# Patient Record
Sex: Female | Born: 1985 | Race: Black or African American | Hispanic: No | Marital: Single | Smoking: Never smoker
Health system: Southern US, Community
[De-identification: ages and names within clinical notes are randomized; demographics above are authoritative.]

## PROBLEM LIST (undated history)

## (undated) ENCOUNTER — Inpatient Hospital Stay: Payer: Self-pay

## (undated) DIAGNOSIS — A6000 Herpesviral infection of urogenital system, unspecified: Secondary | ICD-10-CM

## (undated) DIAGNOSIS — R87629 Unspecified abnormal cytological findings in specimens from vagina: Secondary | ICD-10-CM

## (undated) HISTORY — PX: INDUCED ABORTION: SHX677

---
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
## 2007-03-30 ENCOUNTER — Emergency Department: Payer: Self-pay | Admitting: Emergency Medicine

## 2007-07-07 ENCOUNTER — Emergency Department: Payer: Self-pay | Admitting: Emergency Medicine

## 2007-08-13 ENCOUNTER — Observation Stay: Payer: Self-pay | Admitting: Obstetrics & Gynecology

## 2007-11-10 ENCOUNTER — Observation Stay: Payer: Self-pay | Admitting: Obstetrics and Gynecology

## 2007-12-06 ENCOUNTER — Observation Stay: Payer: Self-pay

## 2007-12-08 ENCOUNTER — Observation Stay: Payer: Self-pay | Admitting: Obstetrics and Gynecology

## 2007-12-12 ENCOUNTER — Inpatient Hospital Stay: Payer: Self-pay | Admitting: Unknown Physician Specialty

## 2009-02-09 ENCOUNTER — Emergency Department: Payer: Self-pay | Admitting: Internal Medicine

## 2009-12-02 ENCOUNTER — Emergency Department: Payer: Self-pay | Admitting: Emergency Medicine

## 2010-05-23 ENCOUNTER — Emergency Department: Payer: Self-pay | Admitting: Emergency Medicine

## 2011-05-04 ENCOUNTER — Emergency Department: Payer: Self-pay | Admitting: Emergency Medicine

## 2012-04-19 ENCOUNTER — Emergency Department: Payer: Self-pay | Admitting: Emergency Medicine

## 2012-04-19 LAB — URINALYSIS, COMPLETE
Bilirubin,UR: NEGATIVE
Glucose,UR: NEGATIVE mg/dL (ref 0–75)
Ketone: NEGATIVE
Nitrite: NEGATIVE
Protein: NEGATIVE
RBC,UR: <1 /HPF (ref 0–5)
Specific Gravity: 1.018 (ref 1.003–1.030)
WBC UR: 2 /HPF (ref 0–5)

## 2012-04-19 LAB — CBC
HCT: 41.2 % (ref 35.0–47.0)
HGB: 14.3 g/dL (ref 12.0–16.0)
RBC: 4.48 10*6/uL (ref 3.80–5.20)
RDW: 12.3 % (ref 11.5–14.5)
WBC: 10.1 10*3/uL (ref 3.6–11.0)

## 2012-04-19 LAB — COMPREHENSIVE METABOLIC PANEL
Alkaline Phosphatase: 134 U/L (ref 50–136)
BUN: 10 mg/dL (ref 7–18)
Bilirubin,Total: 0.6 mg/dL (ref 0.2–1.0)
Calcium, Total: 8.7 mg/dL (ref 8.5–10.1)
Co2: 27 mmol/L (ref 21–32)
Creatinine: 1.01 mg/dL (ref 0.60–1.30)
EGFR (Non-African Amer.): >60
Osmolality: 269 (ref 275–301)
Potassium: 3.3 mmol/L — ABNORMAL LOW (ref 3.5–5.1)
Sodium: 135 mmol/L — ABNORMAL LOW (ref 136–145)
Total Protein: 8 g/dL (ref 6.4–8.2)

## 2013-02-12 ENCOUNTER — Emergency Department: Payer: Self-pay | Admitting: Emergency Medicine

## 2013-11-15 ENCOUNTER — Encounter: Payer: Self-pay | Admitting: General Surgery

## 2013-11-28 ENCOUNTER — Encounter: Payer: Self-pay | Admitting: General Surgery

## 2013-11-28 ENCOUNTER — Ambulatory Visit (INDEPENDENT_AMBULATORY_CARE_PROVIDER_SITE_OTHER): Payer: BC Managed Care – PPO | Admitting: General Surgery

## 2013-11-28 ENCOUNTER — Other Ambulatory Visit: Payer: BC Managed Care – PPO

## 2013-11-28 VITALS — BP 128/78 | HR 74 | Resp 12 | Ht 67.0 in | Wt 197.0 lb

## 2013-11-28 DIAGNOSIS — Z3046 Encounter for surveillance of implantable subdermal contraceptive: Secondary | ICD-10-CM

## 2013-11-28 NOTE — Progress Notes (Signed)
Patient ID: Meredith Riggs, female   DOB: [birth year], 28 y.o.   MRN: [id number]  Chief Complaint  Patient presents with  . Other    evaluation of nexplanon removal left arm    HPI Meredith Riggs is a 28 y.o. female who presents for an evaluation of removal of implanted birth control. She states she went to her OBGYN to have the Nexplanon removed and they were unsuccessful due to how deep the implant was placed.   HPI  No past medical history on file.  Past Surgical History  Procedure Laterality Date  . Cesarean section      No family history on file.  Social History History  Substance Use Topics  . Smoking status: Never Smoker   . Smokeless tobacco: Never Used  . Alcohol Use: Yes    No Known Allergies  Current Outpatient Prescriptions  Medication Sig Dispense Refill  . Etonogestrel (NEXPLANON [place]) Inject into the skin.       No current facility-administered medications for this visit.    Review of Systems Review of Systems  Constitutional: Negative.   Respiratory: Negative.   Cardiovascular: Negative.     Blood pressure 128/78, pulse 74, resp. rate 12, height 5\' 7"  (1.702 m), weight 197 lb (89.359 kg), last menstrual period 11/19/2013.  Physical Exam Physical Exam Physical examination shows evidence of a healed site from previous removal attempt. There is a suggestion of focal thickening correlating with the implant device itself. Data Reviewed Ultrasound examination was used to confirm the location of the implant approximately 6 mm below the skin.  Assessment    Expired implant.    Plan    The patient was amenable to removal. Alcohol was applied the skin followed by 10 cc of 0.5% Xylocaine with 0.25% Marcaine with 1 200,000 units of epinephrine. An additional 2 cc of 1% plain Xylocaine was used to supplement. A small longitudinal incision was made over the anticipated course of the implant. A single 4 cm long 2.5 mm wide implant was extracted without  incident. The skin defect was closed with 4-0 Vicryl subcuticular sutures. Benzoin and Steri-Strips followed by Telfa and Tegaderm dressing was applied. Ice pack provided. Wound care review.  The importance of establishing a new form of birth control to prevent ongoing pregnancy was discussed with the patient.    PCP: None Ref. MD: Alcario DroughtErica Howard/Dr. Pearson GrippeJackson    Itali Mckendry, Merrily PewJeffrey W 11/29/2013, 2:50 PM

## 2013-11-28 NOTE — Patient Instructions (Signed)
Patient to return as needed. The patient is aware to call back for any questions or concerns. 

## 2013-11-29 DIAGNOSIS — Z3046 Encounter for surveillance of implantable subdermal contraceptive: Secondary | ICD-10-CM | POA: Insufficient documentation

## 2014-03-18 ENCOUNTER — Encounter: Payer: Self-pay | Admitting: General Surgery

## 2014-05-17 NOTE — L&D Delivery Note (Signed)
Obstetrical Delivery Note   Date of Delivery:   05/08/2015 Primary OB:   [facility] Gestational Age/EDD: 5984w2d (Dated by 8wk3d ultrasound) Antepartum complications: obessity, prior Cesarean section, hx of HSV II  Delivered By:   Farrel ConnersGUTIERREZ, Nuri Branca, CNM  Delivery Type:   vaginal birth after cesarean (VBAC)  Procedure Details:   SVD of vigorous female infant in OA under epidural anesthesia with short cord. After delayed cord clamping , FOB cut cord and baby placed on mother's chest. Apgars 8&9. Spontaneous delivery of intact placenta and 3 vessel cord. Encounter brisk bleeding after delivery of placenta from both vaginal labial lacerations bilaterally and uterine atony. Bimanual and IV Pitocin and Cytotec 800 mcg PR resolved uterine atony, and repair of lacerations with 3-0 Chromic was accomplished. EBL 700 ml. Initial BP after repair was 76/51 with pulse 140. Began IV bolus of 500 ml LR and BP increased to 101/59 will run 15600ml/hr along with 962ml/hr of Pitocin. CBC ordered at noon. Anesthesia:    epidural Intrapartum complications: Postpartum Hemorrhage GBS:    negative Laceration:    Bilateral labial lacerations extending into vagina at 3 and 9 o'clock repaired with 3-0 Chromic. And small bleeding first degree laceration at introitus was repaired with 3-0 Chromic. Episiotomy:    none Placenta:    Via active 3rd stage. To pathology: no Estimated Blood Loss:  700 ml  Baby:    Liveborn female, Apgars 8/9, weight 7-10/ 3450 Gms/ [name]    Uzziah Rigg, CNM

## 2014-12-03 ENCOUNTER — Emergency Department
Admission: EM | Admit: 2014-12-03 | Discharge: 2014-12-03 | Disposition: A | Discharge status: Home / Self Care | Payer: Medicaid Other | Attending: Emergency Medicine | Admitting: Emergency Medicine

## 2014-12-03 ENCOUNTER — Encounter: Payer: Self-pay | Admitting: Emergency Medicine

## 2014-12-03 DIAGNOSIS — S61101A Unspecified open wound of right thumb with damage to nail, initial encounter: Secondary | ICD-10-CM | POA: Diagnosis not present

## 2014-12-03 DIAGNOSIS — Y998 Other external cause status: Secondary | ICD-10-CM | POA: Insufficient documentation

## 2014-12-03 DIAGNOSIS — Y9389 Activity, other specified: Secondary | ICD-10-CM | POA: Insufficient documentation

## 2014-12-03 DIAGNOSIS — Z3A18 18 weeks gestation of pregnancy: Secondary | ICD-10-CM | POA: Diagnosis not present

## 2014-12-03 DIAGNOSIS — Y9289 Other specified places as the place of occurrence of the external cause: Secondary | ICD-10-CM | POA: Insufficient documentation

## 2014-12-03 DIAGNOSIS — S61309A Unspecified open wound of unspecified finger with damage to nail, initial encounter: Secondary | ICD-10-CM

## 2014-12-03 DIAGNOSIS — O9A212 Injury, poisoning and certain other consequences of external causes complicating pregnancy, second trimester: Secondary | ICD-10-CM | POA: Diagnosis present

## 2014-12-03 DIAGNOSIS — W231XXA Caught, crushed, jammed, or pinched between stationary objects, initial encounter: Secondary | ICD-10-CM | POA: Insufficient documentation

## 2014-12-03 MED ORDER — CEPHALEXIN 500 MG PO CAPS
500.0000 mg | ORAL_CAPSULE | Freq: Four times a day (QID) | ORAL | Status: AC
Start: 2014-12-03 — End: 2014-12-13

## 2014-12-03 NOTE — ED Notes (Signed)
States she hit her right thumbnail about 3 weeks ago. Thumbnail is intact but loosely  Min swelling noted

## 2014-12-03 NOTE — ED Notes (Addendum)
Pt here with right thumbnail pain; pt reports jamming it 3 weeks ago, pt reports nail is only attached by cuticle. Pt has green color under fingernail. Pt also [redacted] weeks pregnant, denies any pain today.

## 2014-12-03 NOTE — ED Provider Notes (Signed)
[facility] Emergency Department Provider Note  ____________________________________________  Time seen: Approximately 11:44 AM  I have reviewed the triage vital signs and the nursing notes.   HISTORY  Chief Complaint Nail Problem    HPI Meredith Riggs is a 29 y.o. female patient here today due to yellowish greenish discharge from her right thumbnail. Patient state 3 weeks ago she jammed her finger and partly avulsion nail. States Tinel's only being held by the cuticle she just knows a discharged yesterday. Patient states she's been keeping the nail taped to the skin since the incident. Denies any pain today. Patient is [redacted] weeks pregnant.   No past medical history on file.  There are no active problems to display for this patient.   No past surgical history on file.  Current Outpatient Rx  Name  Route  Sig  Dispense  Refill  . cephALEXin (KEFLEX) 500 MG capsule   Oral   Take 1 capsule (500 mg total) by mouth 4 (four) times daily.   40 capsule   0     Allergies Review of patient's allergies indicates no known allergies.  No family history on file.  Social History History  Substance Use Topics  . Smoking status: Not on file  . Smokeless tobacco: Not on file  . Alcohol Use: Not on file    Review of Systems Constitutional: No fever/chills Eyes: No visual changes. ENT: No sore throat. Cardiovascular: Denies chest pain. Respiratory: Denies shortness of breath. Gastrointestinal: No abdominal pain.  No nausea, no vomiting.  No diarrhea.  No constipation. Genitourinary: Negative for dysuria. Musculoskeletal: Negative for back pain. Skin: Partially avulsed right thumbnail. Neurological: Negative for headaches, focal weakness or numbness. 10-point ROS otherwise negative.  ____________________________________________   PHYSICAL EXAM:  VITAL SIGNS: ED Triage Vitals  Enc Vitals Group     BP 12/03/14 1129 126/76 mmHg     Pulse Rate  12/03/14 1129 103     Resp 12/03/14 1129 16     Temp 12/03/14 1129 98.4 F (36.9 C)     Temp Source 12/03/14 1129 Oral     SpO2 12/03/14 1130 98 %     Weight 12/03/14 1129 210 lb (95.255 kg)     Height 12/03/14 1129  (1.702 m)     Head Cir --      Peak Flow --      Pain Score 12/03/14 1130 5     Pain Loc --      Pain Edu? --      Excl. in GC? --     Constitutional: Alert and oriented. Well appearing and in no acute distress. Eyes: Conjunctivae are normal. PERRL. EOMI. Head: Atraumatic. Nose: No congestion/rhinnorhea. Mouth/Throat: Mucous membranes are moist.  Oropharynx non-erythematous. Neck: No stridor.  No cervical spine tenderness to palpation. Hematological/Lymphatic/Immunilogical: No cervical lymphadenopathy. Cardiovascular: Normal rate, regular rhythm. Grossly normal heart sounds.  Good peripheral circulation. Respiratory: Normal respiratory effort.  No retractions. Lungs CTAB. Gastrointestinal: Soft and nontender. Distention secondary to pregnancy.. No abdominal bruits. No CVA tenderness. Musculoskeletal: No lower extremity tenderness nor edema.  No joint effusions. Neurologic:  Normal speech and language. No gross focal neurologic deficits are appreciated. No gait instability. Skin:  Skin is warm, dry and intact. No rash noted. Distal right thumbnail is partially avulsion, preferably attach midpoint to cuticle. No obvious edema or erythema. Nontender to palpation. Psychiatric: Mood and affect are normal. Speech and behavior are normal.  ____________________________________________   LABS (all labs ordered are  listed, but only abnormal results are displayed)  Labs Reviewed - No data to display ____________________________________________  EKG   ____________________________________________  RADIOLOGY   ____________________________________________   PROCEDURES  Procedure(s) performed: None  Critical Care performed:  No  ____________________________________________   INITIAL IMPRESSION / ASSESSMENT AND PLAN / ED COURSE  Pertinent labs & imaging results that were available during my care of the patient were reviewed by me and considered in my medical decision making (see chart for details).  Partially avulsed right thumbnail. Skin infection to the right thumbnail. Advised patient to wear splint to protect partially avulsed nail. This patient there will eventually fall off as new nail growth occurs. Patient was started on Keflex 500 mg 4 times a day. Patient advised follow-up family doctor or return to the ER if condition worsens. ____________________________________________   FINAL CLINICAL IMPRESSION(S) / ED DIAGNOSES  Final diagnoses:  Nail avulsion, finger, initial encounter       Joni ReiningRonald K Lawayne Hartig, PA-C 12/03/14 1202  Emily FilbertJonathan E Williams, MD 12/03/14 1239

## 2014-12-04 ENCOUNTER — Encounter: Payer: Self-pay | Admitting: Emergency Medicine

## 2015-04-11 ENCOUNTER — Encounter: Payer: Self-pay | Admitting: *Deleted

## 2015-04-11 ENCOUNTER — Observation Stay
Admission: EM | Admit: 2015-04-11 | Discharge: 2015-04-11 | Disposition: A | Discharge status: Home / Self Care | Payer: Medicaid Other | Attending: Certified Nurse Midwife | Admitting: Certified Nurse Midwife

## 2015-04-11 DIAGNOSIS — O26893 Other specified pregnancy related conditions, third trimester: Principal | ICD-10-CM | POA: Insufficient documentation

## 2015-04-11 DIAGNOSIS — Z3A35 35 weeks gestation of pregnancy: Secondary | ICD-10-CM | POA: Diagnosis not present

## 2015-04-11 DIAGNOSIS — R102 Pelvic and perineal pain: Secondary | ICD-10-CM | POA: Diagnosis present

## 2015-04-11 HISTORY — DX: Unspecified abnormal cytological findings in specimens from vagina: R87.629

## 2015-04-11 NOTE — OB Triage Note (Signed)
Pt. Here for pelvic pain (vaginal pain) when walking. Gets better when I sit down.

## 2015-04-11 NOTE — Progress Notes (Signed)
Discharge instructions reviewed with patient, questions answered, copies signed.  Patient verbalized understanding of all discharge instructions. Copy given.

## 2015-04-21 ENCOUNTER — Emergency Department
Admission: EM | Admit: 2015-04-21 | Discharge: 2015-04-21 | Disposition: A | Discharge status: Home / Self Care | Payer: Medicaid Other | Attending: Emergency Medicine | Admitting: Emergency Medicine

## 2015-04-21 ENCOUNTER — Encounter: Payer: Self-pay | Admitting: Surgery

## 2015-04-21 DIAGNOSIS — Z79899 Other long term (current) drug therapy: Secondary | ICD-10-CM | POA: Diagnosis not present

## 2015-04-21 DIAGNOSIS — R2232 Localized swelling, mass and lump, left upper limb: Secondary | ICD-10-CM | POA: Diagnosis not present

## 2015-04-21 DIAGNOSIS — L02412 Cutaneous abscess of left axilla: Secondary | ICD-10-CM | POA: Insufficient documentation

## 2015-04-21 DIAGNOSIS — L089 Local infection of the skin and subcutaneous tissue, unspecified: Secondary | ICD-10-CM | POA: Diagnosis present

## 2015-04-21 NOTE — Consult Note (Signed)
Surgical Consultation  04/21/2015  Meredith Riggs is an 29 y.o. female.   CC: Left axillary mass  HPI: This a patient with a long-standing left axillary mass. This been there for years and is no worse today than it has been in the last few months. She is pregnant and 9 months pregnant in fact. Apparently she came emergency room because some family members stated that this could be harmful to her baby. He does not have fevers or chills she does have pain but there is no worse than it has been in the past. It is no larger than it has been in the past. It does not change in size with her pregnancies but she has not had her pregnancies before this. I was asked see the patient for a potential left axillary abscess.  Past Medical History  Diagnosis Date  . Vaginal Pap smear, abnormal     Pt. don't know when the last one was abn.     Past Surgical History  Procedure Laterality Date  . Cesarean section      No family history on file.  Social History:  reports that she has never smoked. She does not have any smokeless tobacco history on file. She reports that she does not drink alcohol or use illicit drugs.  Allergies: No Known Allergies  Medications reviewed.   Review of Systems:   Review of Systems  Constitutional: Negative for fever and chills.  HENT: Negative.   Eyes: Negative.   Skin: Negative.      Physical Exam:  There were no vitals taken for this visit.  Physical Exam  Constitutional: She is well-developed, well-nourished, and in no distress. No distress.  Obese  Skin: Skin is warm. No rash noted. No erythema.  Mass in the left axilla measuring approximately 2 x 4 cm with an area of open draining from prior I&D. There is granulation tissue and the only drainage is clear lymphatic or serous fluid. It is no erythema and no purulence and no tenderness.  Vitals reviewed.     No results found for this or any previous visit (from the past 48 hour(s)). No results  found.  Assessment/Plan:  Left axillary mass it is draining serous or lymphatic fluid. This could be colostrum. This could also be an accessory breast. She is 9 months pregnant and is draining this clear fluid which could easily be colostrum. It is not currently infected and represents little to no danger to her unborn child as her family members may have suggested which necessitated her arrival at the emergency room. With this in mind I have suggested that we remove this when she is postpartum or be seen in the office should she worsen she was in agreement with this plan. I discussed this with the physician's assistant  Lattie Hawichard E Dajia Gunnels, MD, FACS

## 2015-04-21 NOTE — ED Provider Notes (Signed)
[facility] Emergency Department Provider Note  ____________________________________________  Time seen: Approximately 11:01 AM  I have reviewed the triage vital signs and the nursing notes.   HISTORY  Chief Complaint Recurrent Skin Infections    HPI Meredith Riggs is a 29 y.o. female who presents for evaluation of an abscess under left armpit for "several years. She seen her previous doctor who attempted to lance it but was told that she needs to see a specialist. She is here for referral.   Past Medical History  Diagnosis Date  . Vaginal Pap smear, abnormal     Pt. don't know when the last one was abn.     Patient Active Problem List   Diagnosis Date Noted  . Vaginal pain 04/11/2015  . Nexplanon removal 11/29/2013    Past Surgical History  Procedure Laterality Date  . Cesarean section      Current Outpatient Rx  Name  Route  Sig  Dispense  Refill  . Etonogestrel (NEXPLANON [place]a)   Subcutaneous   Inject into the skin.           Allergies Review of patient's allergies indicates no known allergies.  History reviewed. No pertinent family history.  Social History Social History  Substance Use Topics  . Smoking status: Never Smoker   . Smokeless tobacco: None  . Alcohol Use: No    Review of Systems Constitutional: No fever/chills Eyes: No visual changes. ENT: No sore throat. Cardiovascular: Denies chest pain. Respiratory: Denies shortness of breath. Genitourinary: Negative for dysuria. Musculoskeletal: Negative for back pain. Skin: Positive for abscess to left axilla measuring approximately 2-1/2 cm nummular with height of approximately 2 cm. This appears to be punctate with a 5 mm opening. Neurological: Negative for headaches, focal weakness or numbness.  10-point ROS otherwise negative.  ____________________________________________   PHYSICAL EXAM:  VITAL SIGNS: ED Triage Vitals  Enc Vitals Group     BP 04/21/15  1005 111/63 mmHg     Pulse Rate 04/21/15 1005 99     Resp 04/21/15 1005 16     Temp 04/21/15 1005 98.7 F (37.1 C)     Temp Source 04/21/15 1005 Oral     SpO2 04/21/15 1005 99 %     Weight 04/21/15 1005 212 lb (96.163 kg)     Height 04/21/15 1005 5\' 8"  (1.727 m)     Head Cir --      Peak Flow --      Pain Score 04/21/15 1006 8     Pain Loc --      Pain Edu? --      Excl. in GC? --     Constitutional: Alert and oriented. Well appearing and in no acute distress. Eyes: Conjunctivae are normal. PERRL. EOMI. Head: Atraumatic. Nose: No congestion/rhinnorhea. Mouth/Throat: Mucous membranes are moist.  Oropharynx non-erythematous. Neck: No stridor.   Cardiovascular: Normal rate, regular rhythm. Grossly normal heart sounds.  Good peripheral circulation. Respiratory: Normal respiratory effort.  No retractions. Lungs CTAB. Gastrointestinal: Soft and nontender. No distention. No abdominal bruits. No CVA tenderness. Musculoskeletal: No lower extremity tenderness nor edema.  No joint effusions. Neurologic:  Normal speech and language. No gross focal neurologic deficits are appreciated. No gait instability. Skin:  Skin is warm, dry and intact. 2 cm lesion noted greatest approximately the same height with punctate middle. Psychiatric: Mood and affect are normal. Speech and behavior are normal.  ____________________________________________   LABS (all labs ordered are listed, but only abnormal results are  displayed)  Labs Reviewed - No data to display ____________________________________________    PROCEDURES  Procedure(s) performed: None  Critical Care performed: No  ____________________________________________   INITIAL IMPRESSION / ASSESSMENT AND PLAN / ED COURSE  Pertinent labs & imaging results that were available during my care of the patient were reviewed by me and considered in my medical decision making (see chart for details).  Dr. Excell Seltzer surgeon on call notified to  see patient in ED patient diagnosed with having a left axillary mass probably consistent with a superfluous breast secondary to her current pregnancy. She is to see Dr. Excell Seltzer after delivery of her baby for sedation under general anesthesia and removal of mass.   ____________________________________________   FINAL CLINICAL IMPRESSION(S) / ED DIAGNOSES  Final diagnoses:  Mass of axilla, left      Evangeline Dakin, PA-C 04/21/15 1816  Jene Every, MD 04/22/15 [phone]

## 2015-04-21 NOTE — ED Notes (Signed)
Hx abcess under left arm pit

## 2015-04-21 NOTE — ED Notes (Signed)
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

## 2015-05-04 ENCOUNTER — Observation Stay
Admission: EM | Admit: 2015-05-04 | Discharge: 2015-05-05 | Disposition: A | Discharge status: Home / Self Care | Payer: Medicaid Other | Attending: Obstetrics and Gynecology | Admitting: Obstetrics and Gynecology

## 2015-05-04 DIAGNOSIS — O0993 Supervision of high risk pregnancy, unspecified, third trimester: Principal | ICD-10-CM | POA: Insufficient documentation

## 2015-05-04 DIAGNOSIS — O34219 Maternal care for unspecified type scar from previous cesarean delivery: Secondary | ICD-10-CM | POA: Insufficient documentation

## 2015-05-04 DIAGNOSIS — Z3A38 38 weeks gestation of pregnancy: Secondary | ICD-10-CM | POA: Insufficient documentation

## 2015-05-05 DIAGNOSIS — O34219 Maternal care for unspecified type scar from previous cesarean delivery: Secondary | ICD-10-CM | POA: Diagnosis not present

## 2015-05-05 DIAGNOSIS — O0993 Supervision of high risk pregnancy, unspecified, third trimester: Secondary | ICD-10-CM | POA: Diagnosis not present

## 2015-05-05 DIAGNOSIS — Z3A38 38 weeks gestation of pregnancy: Secondary | ICD-10-CM | POA: Diagnosis not present

## 2015-05-05 NOTE — Progress Notes (Signed)
Pt was given d/c inst and verbalized understanding.   She was then d/c home in stable condition.

## 2015-05-05 NOTE — Final Progress Note (Signed)
Physician Final Progress Note  Patient ID: Meredith Riggs MRN: [id number] DOB/AGE: 1985/09/26 29 y.o.  Admit date: 05/04/2015 Admitting provider: Conard NovakStephen D Tobe Kervin, MD Discharge date: 05/05/2015   Admission Diagnoses:  1) Supervision high risk pregnancy, third trimester 2) [redacted] weeks gestation 3) history of cesarean delivery, currently pregnant  Discharge Diagnoses:  1) Supervision high risk pregnancy, third trimester 2) [redacted] weeks gestation 3) history of cesarean delivery, currently pregnant  History of Present Illness: The patient is a 29 y.o. female G4P0011 at 1827w6d who presents for concern for rupture of membranes. She states that on Friday she had a gush of clear fluid that soaked her underwear, but did not run down her leg. She has continued to have discharge since that time. This morning and today she states that she had a very mucusy discharge with a slight pink tinge.  She notes +FM, no LOF, and no contractions.    Past Medical History  Diagnosis Date  . Vaginal Pap smear, abnormal     Pt. don't know when the last one was abn.     Past Surgical History  Procedure Laterality Date  . Cesarean section     1) valtrex 500mg  po bid 2) PNV  Allergies: No Known Allergies  Social History   Social History  . Marital Status: Single    Spouse Name: N/A  . Number of Children: N/A  . Years of Education: N/A   Occupational History  . Not on file.   Social History Main Topics  . Smoking status: Never Smoker   . Smokeless tobacco: Not on file  . Alcohol Use: No  . Drug Use: No  . Sexual Activity: No   Other Topics Concern  . Not on file   Social History Narrative   ** Merged History Encounter **        Physical Exam: BP 99/74 mmHg  Pulse 88  Temp(Src) 98.6 F (37 C) (Oral)  Resp 16  Gen: NAD CV: RRR Pulm: CTAB Pelvic: no pooling, nitrazine negative, ferning negative, no obvious dilation, (1cm per RN) Ext: no e/c/t  Consults: None  Significant  Findings/ Diagnostic Studies: none  Procedures:  Nonstress test: baseline 130/mod variability (episode of marked variability), +accels, one variable deceleration. Biophysical Profile: 8/10 (with reactive NST), -2 for no breathing.  Very active fetus during exam with AFI 13cm.  Discharge Condition: stable  Disposition: 01-Home or Self Care  Diet: Regular diet  Discharge Activity: Activity as tolerated  Total time spent taking care of this patient: 60 minutes  Signed: Conard NovakJackson, Daley Gosse D, MD  05/05/2015, 12:32 AM

## 2015-05-07 ENCOUNTER — Inpatient Hospital Stay: Payer: Medicaid Other | Admitting: Anesthesiology

## 2015-05-07 ENCOUNTER — Inpatient Hospital Stay
Admission: RE | Admit: 2015-05-07 | Discharge: 2015-05-09 | DRG: 774 | Disposition: A | Discharge status: Home / Self Care | Payer: Medicaid Other | Source: Ambulatory Visit | Attending: Certified Nurse Midwife | Admitting: Certified Nurse Midwife

## 2015-05-07 ENCOUNTER — Encounter: Payer: Self-pay | Admitting: Certified Nurse Midwife

## 2015-05-07 DIAGNOSIS — O34219 Maternal care for unspecified type scar from previous cesarean delivery: Principal | ICD-10-CM | POA: Diagnosis present

## 2015-05-07 DIAGNOSIS — D649 Anemia, unspecified: Secondary | ICD-10-CM | POA: Diagnosis not present

## 2015-05-07 DIAGNOSIS — O99214 Obesity complicating childbirth: Secondary | ICD-10-CM | POA: Diagnosis present

## 2015-05-07 DIAGNOSIS — B009 Herpesviral infection, unspecified: Secondary | ICD-10-CM | POA: Diagnosis present

## 2015-05-07 DIAGNOSIS — Z3A39 39 weeks gestation of pregnancy: Secondary | ICD-10-CM | POA: Diagnosis not present

## 2015-05-07 DIAGNOSIS — Z6831 Body mass index (BMI) 31.0-31.9, adult: Secondary | ICD-10-CM

## 2015-05-07 DIAGNOSIS — O9852 Other viral diseases complicating childbirth: Secondary | ICD-10-CM | POA: Diagnosis present

## 2015-05-07 DIAGNOSIS — O9081 Anemia of the puerperium: Secondary | ICD-10-CM | POA: Diagnosis not present

## 2015-05-07 HISTORY — DX: Herpesviral infection of urogenital system, unspecified: A60.00

## 2015-05-07 LAB — ABO/RH: ABO/RH(D): O POS

## 2015-05-07 LAB — CBC
HEMATOCRIT: 36.5 % (ref 35.0–47.0)
HEMOGLOBIN: 12 g/dL (ref 12.0–16.0)
MCH: 29.6 pg (ref 26.0–34.0)
MCHC: 32.8 g/dL (ref 32.0–36.0)
MCV: 90.3 fL (ref 80.0–100.0)
Platelets: 163 10*3/uL (ref 150–440)
RBC: 4.05 MIL/uL (ref 3.80–5.20)
RDW: 14.4 % (ref 11.5–14.5)
WBC: 17.2 10*3/uL — AB (ref 3.6–11.0)

## 2015-05-07 LAB — TYPE AND SCREEN
ABO/RH(D): O POS
ANTIBODY SCREEN: NEGATIVE

## 2015-05-07 MED ORDER — FENTANYL 2.5 MCG/ML W/ROPIVACAINE 0.2% IN NS 100 ML EPIDURAL INFUSION (ARMC-ANES)
EPIDURAL | Status: AC
  Administered 2015-05-07: 9 mL/h via EPIDURAL
  Filled 2015-05-07: qty 100

## 2015-05-07 MED ORDER — OXYTOCIN BOLUS FROM INFUSION: 500.0000 mL | INTRAVENOUS | Status: DC

## 2015-05-07 MED ORDER — LACTATED RINGERS IV SOLN
500.0000 mL | INTRAVENOUS | Status: DC | PRN
  Administered 2015-05-08: 500 mL via INTRAVENOUS

## 2015-05-07 MED ORDER — OXYTOCIN 40 UNITS IN LACTATED RINGERS INFUSION - SIMPLE MED
62.5000 mL/h | INTRAVENOUS | Status: DC
  Administered 2015-05-08: 62.5 mL/h via INTRAVENOUS

## 2015-05-07 MED ORDER — BUPIVACAINE HCL (PF) 0.25 % IJ SOLN
INTRAMUSCULAR | Status: DC | PRN
  Administered 2015-05-07: 5 mL via EPIDURAL

## 2015-05-07 MED ORDER — LACTATED RINGERS IV SOLN
INTRAVENOUS | Status: DC
  Administered 2015-05-07: 21:00:00 via INTRAVENOUS

## 2015-05-07 MED ORDER — ONDANSETRON HCL 4 MG/2ML IJ SOLN
4.0000 mg | Freq: Four times a day (QID) | INTRAMUSCULAR | Status: DC | PRN
Start: 2015-05-07 — End: 2015-05-08

## 2015-05-07 MED ORDER — LIDOCAINE HCL (PF) 1 % IJ SOLN
30.0000 mL | INTRAMUSCULAR | Status: AC | PRN
  Administered 2015-05-07: 3 mL via SUBCUTANEOUS

## 2015-05-07 MED ORDER — BUTORPHANOL TARTRATE 1 MG/ML IJ SOLN
INTRAMUSCULAR | Status: AC
  Administered 2015-05-07: 2 mg via INTRAVENOUS
  Filled 2015-05-07: qty 2

## 2015-05-07 MED ORDER — BUTORPHANOL TARTRATE 1 MG/ML IJ SOLN
2.0000 mg | INTRAMUSCULAR | Status: DC | PRN
  Administered 2015-05-07: 2 mg via INTRAVENOUS

## 2015-05-07 MED ORDER — LIDOCAINE-EPINEPHRINE (PF) 1.5 %-1:200000 IJ SOLN
INTRAMUSCULAR | Status: DC | PRN
  Administered 2015-05-07: 4 mL via PERINEURAL

## 2015-05-07 NOTE — H&P (Signed)
OB History & Physical   History of Present Illness:  Chief Complaint:  Complains of strong regular contractions since 1600 today that are now every 5 min.  No VB or LOF HPI:  Meredith Riggs is a 28 y.o. G4P1021 BF with EDC=05/13/2015  at [redacted]w[redacted]d dated by 8wk3d ultrasound.  Her pregnancy has been complicated by history of prior LTCS in 2009 for FITL and a hx of HSV II.Marland Kitchen  She presents to L&D for evaluation of labor. She has been counseled several times regarding risks and benefits of trial of labor after prior Cesarean and she is adamant about proceeding with a trial of labor and is willing to accept risks of uterine rupture.    Prenatal care site: Prenatal care at [facility] has also been remarkable for obesity with starting BMI=31. Total weight gain was 24# and glucola testing has been WNL. Has been taking Valtrex since 36 weeks and reports infrequent outbreaks with no sx present on admission.       Maternal Medical History:   Past Medical History  Diagnosis Date  . Vaginal Pap smear, abnormal     Pt. don't know when the last one was abn.   Marland Kitchen Herpes genitalis     Past Surgical History  Procedure Laterality Date  . Cesarean section      No Known Allergies  Prior to Admission medications   Medication Sig Start Date End Date Taking? Authorizing Provider  Prenatal Vit-Fe Fumarate-FA (MULTIVITAMIN-PRENATAL) 27-0.8 MG TABS tablet Take 1 tablet by mouth daily at 12 noon.   Yes Historical Provider, MD  valACYclovir (VALTREX) 500 MG tablet Take 500 mg by mouth daily.   Yes Historical Provider, MD          Social History: She  reports that she has never smoked. She does not have any smokeless tobacco history on file. She reports that she does not drink alcohol or use illicit drugs.  Family History: family history is not on file.   Review of Systems: Negative x 10 systems reviewed except as noted in the HPI.      Physical Exam:  Vital Signs: BP 130/91 mmHg  Pulse 99  Temp(Src)  98.7 F (37.1 C) (Oral)  Resp 18 General: very uncomfortable on arrival, difficulty monitoring patient and getting a BP as she was moving all over the bed. More comfortable now after Stadol 2 mgm HEENT: normocephalic, atraumatic Heart: regular rate & rhythm.  No murmurs Lungs: clear to auscultation bilaterally Abdomen: soft, gravid, non-tender;  EFW: 8# Pelvic:   External: Normal external female genitalia, no lesions seen on RN exam  Cervix: 4/90%/-2 per RN exam    Extremities: non-tender, symmetric, trace edema bilaterally.  DTRs:+1  Neurologic: Alert & oriented x 3.    Pertinent Results:  Prenatal Labs: Blood type/Rh O positive  Antibody screen negative  Rubella Varicella Immune Immune  RPR Non reactive  HBsAg negative  HIV Non reactive  GC negative  Chlamydia negative  Genetic screening First test was negative/ MSAFP negative  1 hour GTT 78/ 112  3 hour GTT NA  GBS negative on 04/15/2015   Baseline FHR: currently baseline 120 with accels to 150s to 160 with moderate variability  Bedside Ultrasound:  OP presentation Placenta is fundal  Assessment:  Meredith Riggs is a 29 y.o. G72P1021 female at [redacted]w[redacted]d in early labor Prior cesarean section-desires TOLAC  Plan:  1. Admit to Labor & Delivery - notify attending & anesthesia.   2. CBC, T&S, Clrs, IVF  3. GBS negative-PPX not indicated.   4. Consents obtained. 5. VBAC protocol instituted 6. Continuous EFM 7. TDAP and Flu-vaccinate pp if needed.  Farrel ConnersGUTIERREZ, Vernette Moise  05/07/2015 9:06 PM

## 2015-05-07 NOTE — Anesthesia Procedure Notes (Signed)
Epidural Patient location during procedure: OB  Staffing Performed by: anesthesiologist   Preanesthetic Checklist Completed: patient identified, site marked, surgical consent, pre-op evaluation, timeout performed, IV checked, risks and benefits discussed and monitors and equipment checked  Epidural Patient position: sitting Prep: Betadine Patient monitoring: heart rate, continuous pulse ox and blood pressure Approach: midline Location: L4-L5 Injection technique: LOR saline  Needle:  Needle type: Tuohy  Needle gauge: 18 G Needle length: 9 cm and 9 Needle insertion depth: 7 cm Catheter type: closed end flexible Catheter size: 20 Guage Catheter at skin depth: 12 cm Test dose: negative and 1.5% lidocaine with Epi 1:200 K  Assessment Sensory level: T10 Events: blood not aspirated, injection not painful, no injection resistance, negative IV test and no paresthesia  Additional Notes   Patient tolerated the insertion well without complications.-SATD -IVTD. No paresthesia. [facility] for VS and dosingReason for block:procedure for pain

## 2015-05-07 NOTE — Anesthesia Preprocedure Evaluation (Signed)
Anesthesia Evaluation  Patient identified by MRN, date of birth, ID band Patient awake    Reviewed: Allergy & Precautions, H&P , NPO status , Patient's Chart, lab work & pertinent test results, reviewed documented beta blocker date and time   Airway Mallampati: III  TM Distance: >3 FB Neck ROM: full    Dental no notable dental hx. (+) Teeth Intact   Pulmonary neg pulmonary ROS,    Pulmonary exam normal breath sounds clear to auscultation       Cardiovascular Exercise Tolerance: Good negative cardio ROS Normal cardiovascular exam Rhythm:regular Rate:Normal     Neuro/Psych Hx of low back pain negative neurological ROS  negative psych ROS   GI/Hepatic negative GI ROS, Neg liver ROS,   Endo/Other  negative endocrine ROS  Renal/GU negative Renal ROS  negative genitourinary   Musculoskeletal   Abdominal   Peds  Hematology negative hematology ROS (+)   Anesthesia Other Findings   Reproductive/Obstetrics (+) Pregnancy                             Anesthesia Physical Anesthesia Plan  ASA: II and emergent  Anesthesia Plan: Regional and Epidural   Post-op Pain Management:    Induction:   Airway Management Planned:   Additional Equipment:   Intra-op Plan:   Post-operative Plan:   Informed Consent: I have reviewed the patients History and Physical, chart, labs and discussed the procedure including the risks, benefits and alternatives for the proposed anesthesia with the patient or authorized representative who has indicated his/her understanding and acceptance.     Plan Discussed with: CRNA  Anesthesia Plan Comments:         Anesthesia Quick Evaluation

## 2015-05-07 NOTE — Progress Notes (Signed)
L&D Progress Note  S: Not interested in epidural at this time; having a lot of back labor  O: General : breathing thru contractions better BP 124/76 Difficulty picking up FHR with patient sitting up Cervix: 4/C/-1 AROM clear fluid. FSE applied: FHR 130 with accels and moderate variability Contractions q4-6 min apart, contractions lasting >60 sec  A: No cervical change Cat 1 FHR tracing  P: Recheck in 2 hours for progress Patient wishes to continue with Stadol for pain at this time Explained benefits of epidural, but patient has had problems with back and is afraid that the epidural will worsen back pain.  Meredith Riggs, Meredith Riggs, CNM

## 2015-05-08 ENCOUNTER — Encounter
Admission: RE | Disposition: A | Discharge status: Home / Self Care | Payer: Self-pay | Source: Ambulatory Visit | Attending: Certified Nurse Midwife

## 2015-05-08 LAB — CBC
HEMATOCRIT: 25.7 % — AB (ref 35.0–47.0)
Hemoglobin: 8.4 g/dL — ABNORMAL LOW (ref 12.0–16.0)
MCH: 29.4 pg (ref 26.0–34.0)
MCHC: 32.7 g/dL (ref 32.0–36.0)
MCV: 90.1 fL (ref 80.0–100.0)
Platelets: 136 10*3/uL — ABNORMAL LOW (ref 150–440)
RBC: 2.85 MIL/uL — ABNORMAL LOW (ref 3.80–5.20)
RDW: 14.1 % (ref 11.5–14.5)
WBC: 23.4 10*3/uL — AB (ref 3.6–11.0)

## 2015-05-08 SURGERY — Surgical Case
Anesthesia: Choice

## 2015-05-08 MED ORDER — IBUPROFEN 600 MG PO TABS
ORAL_TABLET | ORAL | Status: AC
  Administered 2015-05-08: 600 mg via ORAL
  Filled 2015-05-08: qty 1

## 2015-05-08 MED ORDER — PRENATAL MULTIVITAMIN CH
1.0000 | ORAL_TABLET | Freq: Every day | ORAL | Status: DC
  Administered 2015-05-08: 1 via ORAL
  Filled 2015-05-08: qty 1

## 2015-05-08 MED ORDER — DIPHENHYDRAMINE HCL 50 MG/ML IJ SOLN: 12.5000 mg | INTRAMUSCULAR | Status: DC | PRN

## 2015-05-08 MED ORDER — OXYCODONE-ACETAMINOPHEN 5-325 MG PO TABS: 1.0000 | ORAL_TABLET | ORAL | Status: DC | PRN

## 2015-05-08 MED ORDER — ONDANSETRON HCL 4 MG/2ML IJ SOLN: 4.0000 mg | INTRAMUSCULAR | Status: DC | PRN

## 2015-05-08 MED ORDER — FERROUS SULFATE 325 (65 FE) MG PO TABS
325.0000 mg | ORAL_TABLET | Freq: Every day | ORAL | Status: DC
  Administered 2015-05-09: 325 mg via ORAL
  Filled 2015-05-08: qty 1

## 2015-05-08 MED ORDER — IBUPROFEN 600 MG PO TABS
600.0000 mg | ORAL_TABLET | Freq: Four times a day (QID) | ORAL | Status: DC
  Administered 2015-05-08 – 2015-05-09 (×4): 600 mg via ORAL
  Filled 2015-05-08 (×4): qty 1

## 2015-05-08 MED ORDER — TETANUS-DIPHTH-ACELL PERTUSSIS 5-2.5-18.5 LF-MCG/0.5 IM SUSP: 0.5000 mL | Freq: Once | INTRAMUSCULAR | Status: DC

## 2015-05-08 MED ORDER — LACTATED RINGERS IV BOLUS (SEPSIS): 500.0000 mL | Freq: Once | INTRAVENOUS | Status: DC

## 2015-05-08 MED ORDER — MISOPROSTOL 200 MCG PO TABS
ORAL_TABLET | ORAL | Status: AC
  Administered 2015-05-08: 800 ug
  Filled 2015-05-08: qty 4

## 2015-05-08 MED ORDER — WITCH HAZEL-GLYCERIN EX PADS: 1.0000 "application " | MEDICATED_PAD | CUTANEOUS | Status: DC | PRN

## 2015-05-08 MED ORDER — OXYCODONE-ACETAMINOPHEN 5-325 MG PO TABS
2.0000 | ORAL_TABLET | ORAL | Status: DC | PRN
  Administered 2015-05-08 – 2015-05-09 (×5): 2 via ORAL
  Filled 2015-05-08 (×5): qty 2

## 2015-05-08 MED ORDER — FENTANYL 2.5 MCG/ML W/ROPIVACAINE 0.2% IN NS 100 ML EPIDURAL INFUSION (ARMC-ANES): 9.0000 mL/h | EPIDURAL | Status: DC

## 2015-05-08 MED ORDER — PHENYLEPHRINE 40 MCG/ML (10ML) SYRINGE FOR IV PUSH (FOR BLOOD PRESSURE SUPPORT): 80.0000 ug | PREFILLED_SYRINGE | INTRAVENOUS | Status: DC | PRN

## 2015-05-08 MED ORDER — ONDANSETRON HCL 4 MG PO TABS: 4.0000 mg | ORAL_TABLET | ORAL | Status: DC | PRN

## 2015-05-08 MED ORDER — EPHEDRINE 5 MG/ML INJ: 10.0000 mg | INTRAVENOUS | Status: DC | PRN

## 2015-05-08 MED ORDER — DIBUCAINE 1 % RE OINT
1.0000 | TOPICAL_OINTMENT | RECTAL | Status: DC | PRN
Start: 2015-05-08 — End: 2015-05-09

## 2015-05-08 MED ORDER — LIDOCAINE HCL (PF) 1 % IJ SOLN
INTRAMUSCULAR | Status: AC
  Filled 2015-05-08: qty 30

## 2015-05-08 MED ORDER — SODIUM CHLORIDE 0.9 % IJ SOLN
INTRAMUSCULAR | Status: AC
  Filled 2015-05-08: qty 3

## 2015-05-08 MED ORDER — AMMONIA AROMATIC IN INHA
RESPIRATORY_TRACT | Status: AC
  Filled 2015-05-08: qty 10

## 2015-05-08 MED ORDER — OXYTOCIN 40 UNITS IN LACTATED RINGERS INFUSION - SIMPLE MED
62.5000 mL/h | INTRAVENOUS | Status: DC | PRN
  Filled 2015-05-08: qty 1000

## 2015-05-08 MED ORDER — LANOLIN HYDROUS EX OINT: TOPICAL_OINTMENT | CUTANEOUS | Status: DC | PRN

## 2015-05-08 MED ORDER — MISOPROSTOL 200 MCG PO TABS: 800.0000 ug | ORAL_TABLET | ORAL | Status: DC

## 2015-05-08 MED ORDER — OXYTOCIN 10 UNIT/ML IJ SOLN
INTRAMUSCULAR | Status: AC
  Filled 2015-05-08: qty 2

## 2015-05-08 MED ORDER — BENZOCAINE-MENTHOL 20-0.5 % EX AERO
1.0000 "application " | INHALATION_SPRAY | CUTANEOUS | Status: DC | PRN
  Administered 2015-05-08: 1 via TOPICAL
  Filled 2015-05-08: qty 56

## 2015-05-08 MED ORDER — LACTATED RINGERS IV SOLN: INTRAVENOUS | Status: DC

## 2015-05-08 MED ORDER — SIMETHICONE 80 MG PO CHEW: 80.0000 mg | CHEWABLE_TABLET | ORAL | Status: DC | PRN

## 2015-05-08 MED ORDER — DOCUSATE SODIUM 100 MG PO CAPS
100.0000 mg | ORAL_CAPSULE | Freq: Every day | ORAL | Status: DC
  Administered 2015-05-08 – 2015-05-09 (×2): 100 mg via ORAL
  Filled 2015-05-08 (×2): qty 1

## 2015-05-08 NOTE — Progress Notes (Signed)
Pt was up to void for first void post delivery. Voided QS. Pericare performed well per pt. Pt stated she felt dizzy and with assistance of two additional RN staff members, pt was guided safely to wheelchair where a cool cloth was placed and amonnia was placed by her face. She was the alert and oriented. Did not pass out and did not fall. Ellison Carwin Meshach Perry RNC

## 2015-05-08 NOTE — Progress Notes (Signed)
Notify Tia Masker Greene, Rn of bp

## 2015-05-08 NOTE — Lactation Note (Signed)
This note was copied from the chart of Meredith Marlyne Beardseshia Cozzens. Lactation Consultation Note  Patient Name: Meredith Riggs ZOXWR'UToday's Date: 05/08/2015 Reason for consult: Initial assessment   Maternal Data  No medical condition noted that will impact breast feeding.  Feeding Feeding Type: Breast Fed Length of feed: 20 min  LATCH Score/Interventions Latch: Grasps breast easily, tongue down, lips flanged, rhythmical sucking. Intervention(s): Assist with latch  Audible Swallowing: A few with stimulation Intervention(s): Skin to skin  Type of Nipple: Everted at rest and after stimulation  Comfort (Breast/Nipple): Soft / non-tender     Hold (Positioning): Assistance needed to correctly position infant at breast and maintain latch. Intervention(s): Breastfeeding basics reviewed  LATCH Score: 8  Lactation Tools Discussed/Used   Support pillows.  Consult Status   PRN   Trudee GripCarolyn P Wolfgang Finigan 05/08/2015, 6:29 PM

## 2015-05-08 NOTE — Plan of Care (Signed)
Pt ready for transfer via wheelchair in stable condition to 350. Report to next RN. Ellison Carwin Asees Manfredi RNC

## 2015-05-08 NOTE — Discharge Summary (Signed)
Physician Obstetric Discharge Summary  Patient ID: Meredith Isaacseshia D Peri MRN: [id number] DOB/AGE: 22-Oct-1985 29 y.o.   Date of Admission: 05/07/2015  Date of Discharge:   Admitting Diagnosis: term labor at 974w2d  Secondary Diagnosis: Prior Cesarean section  Mode of Delivery: normal spontaneous vaginal delivery 05/08/2015       Discharge Diagnosis: Vaginal birth after Cesarean section. Term intrauterin pregnancy delivered. Postpartum hemorrhage. 700mL   Intrapartum Procedures: Atificial rupture of membranes, epidural, placement of fetal scalp electrode and repair of vaginal-labial lacerations   Post partum procedures: none  Complications: none   Brief Hospital Course  Meredith Riggs is a [id number] who had a VBAC on 05/08/2015;  for further details of this delivery, please refer to the delivery note.  Patient had an uncomplicated postpartum course.  By time of discharge on PPD#1, her pain was controlled on oral pain medications; she had appropriate lochia and was ambulating, voiding without difficulty and tolerating regular diet.  She was deemed stable for discharge to home.    Labs: CBC Latest Ref Rng 05/09/2015 05/08/2015 05/07/2015  WBC 3.6 - 11.0 K/uL 16.8(H) 23.4(H) 17.2(H)  Hemoglobin 12.0 - 16.0 g/dL 7.6(L) 8.4(L) 12.0  Hematocrit 35.0 - 47.0 % 23.2(L) 25.7(L) 36.5  Platelets 150 - 440 K/uL 127(L) 136(L) 163   O POS// RI// VI// GBS negative  Physical exam:  Blood pressure 99/58, pulse 78, temperature 97.7 F (36.5 C), temperature source Oral, resp. rate 18, height 5\' 8"  (1.727 m), weight 96.163 kg (212 lb), SpO2 100 %, unknown if currently breastfeeding. General: alert and no distress Lochia: appropriate Abdomen: soft, NT Uterine Fundus: firm Extremities: No evidence of DVT seen on physical exam. No lower extremity edema.  Discharge Instructions: Per After Visit Summary. Activity: Advance as tolerated. Pelvic rest for 6 weeks.  Also refer to Discharge  Instructions Diet: Regular Medications:   Medication List    TAKE these medications        docusate sodium 100 MG capsule  Commonly known as:  COLACE  Take 1 capsule (100 mg total) by mouth 2 (two) times daily.     ferrous sulfate 325 (65 FE) MG tablet  Take 1 tablet (325 mg total) by mouth 2 (two) times daily with a meal.     ibuprofen 600 MG tablet  Commonly known as:  ADVIL,MOTRIN  Take 1 tablet (600 mg total) by mouth every 6 (six) hours as needed.     multivitamin-prenatal 27-0.8 MG Tabs tablet  Take 1 tablet by mouth daily at 12 noon.     oxyCODONE-acetaminophen 5-325 MG tablet  Commonly known as:  PERCOCET/ROXICET  Take 1 tablet by mouth every 6 (six) hours as needed for severe pain.     valACYclovir 500 MG tablet  Commonly known as:  VALTREX  Take 500 mg by mouth daily.       Outpatient follow up:  Follow-up Information    Follow up with GUTIERREZ, COLLEEN, CNM. Call in 4 weeks.   Specialty:  Certified Nurse Midwife   Why:  post partum visit   Contact information:   [address] [place] [address] [phone]      Postpartum contraception: unsure  Discharged Condition: good  Discharged to: home   Newborn Data: Disposition:home with mother  Apgars: APGAR (1 MIN): 8   APGAR (5 MINS): 9     Baby Feeding: Breast/ 7#10 oz/ Luciano CutterSamora  Percell Lamboy, MD 05/09/2015 9:33 AM

## 2015-05-09 ENCOUNTER — Inpatient Hospital Stay: Admission: RE | Admit: 2015-05-09 | Payer: Medicaid Other | Source: Ambulatory Visit

## 2015-05-09 LAB — CBC
HCT: 23.2 % — ABNORMAL LOW (ref 35.0–47.0)
HEMOGLOBIN: 7.6 g/dL — AB (ref 12.0–16.0)
MCH: 29.5 pg (ref 26.0–34.0)
MCHC: 32.9 g/dL (ref 32.0–36.0)
MCV: 89.8 fL (ref 80.0–100.0)
Platelets: 127 10*3/uL — ABNORMAL LOW (ref 150–440)
RBC: 2.59 MIL/uL — AB (ref 3.80–5.20)
RDW: 14.6 % — ABNORMAL HIGH (ref 11.5–14.5)
WBC: 16.8 10*3/uL — ABNORMAL HIGH (ref 3.6–11.0)

## 2015-05-09 LAB — RPR: RPR: NONREACTIVE

## 2015-05-09 MED ORDER — DOCUSATE SODIUM 100 MG PO CAPS: 100.0000 mg | ORAL_CAPSULE | Freq: Two times a day (BID) | ORAL | Status: DC

## 2015-05-09 MED ORDER — IBUPROFEN 600 MG PO TABS: 600.0000 mg | ORAL_TABLET | Freq: Four times a day (QID) | ORAL | Status: DC | PRN

## 2015-05-09 MED ORDER — FERROUS SULFATE 325 (65 FE) MG PO TABS: 325.0000 mg | ORAL_TABLET | Freq: Two times a day (BID) | ORAL | Status: DC

## 2015-05-09 MED ORDER — OXYCODONE-ACETAMINOPHEN 5-325 MG PO TABS: 1.0000 | ORAL_TABLET | Freq: Four times a day (QID) | ORAL | Status: DC | PRN

## 2015-05-09 NOTE — Anesthesia Postprocedure Evaluation (Signed)
Anesthesia Post Note  Patient: Meredith Riggs  Procedure(s) Performed: * No procedures listed *  Patient location during evaluation: [facility] Anesthesia Type: Epidural Level of consciousness: awake and alert and oriented Pain management: satisfactory to patient Vital Signs Assessment: post-procedure vital signs reviewed and stable Respiratory status: spontaneous breathing Cardiovascular status: blood pressure returned to baseline Anesthetic complications: no    Last Vitals:  Filed Vitals:   05/08/15 2355 05/09/15 0350  BP: 93/51 112/64  Pulse: 80 86  Temp:    Resp: 20 20    Last Pain:  Filed Vitals:   05/09/15 0735  PainSc: 6                  Clydene PughBeane, Larhonda Dettloff D

## 2015-05-09 NOTE — Discharge Instructions (Signed)
Vaginal Delivery, Care After Refer to this sheet in the next few weeks. These discharge instructions provide you with information on caring for yourself after delivery. Your caregiver may also give you specific instructions. Your treatment has been planned according to the most current medical practices available, but problems sometimes occur. Call your caregiver if you have any problems or questions after you go home. HOME CARE INSTRUCTIONS 1. Take over-the-counter or prescription medicines only as directed by your caregiver or pharmacist. 2. Do not drink alcohol, especially if you are breastfeeding or taking medicine to relieve pain. 3. Do not smoke tobacco. 4. Continue to use good perineal care. Good perineal care includes: 1. Wiping your perineum from back to front 2. Keeping your perineum clean. 3. You can do sitz baths twice a day, to help keep this area clean 5. Do not use tampons, douche or have sex until your caregiver says it is okay. 6. Shower only and avoid sitting in submerged water, aside from sitz baths 7. Wear a well-fitting bra that provides breast support. 8. Eat healthy foods. 9. Drink enough fluids to keep your urine clear or pale yellow. 10. Eat high-fiber foods such as whole grain cereals and breads, brown rice, beans, and fresh fruits and vegetables every day. These foods may help prevent or relieve constipation. 11. Avoid constipation with high fiber foods or medications, such as miralax or metamucil 12. Follow your caregiver's recommendations regarding resumption of activities such as climbing stairs, driving, lifting, exercising, or traveling. 13. Talk to your caregiver about resuming sexual activities. Resumption of sexual activities is dependent upon your risk of infection, your rate of healing, and your comfort and desire to resume sexual activity. 14. Try to have someone help you with your household activities and your newborn for at least a few days after you leave  the hospital. 15. Rest as much as possible. Try to rest or take a nap when your newborn is sleeping. 16. Increase your activities gradually. 17. Keep all of your scheduled postpartum appointments. It is very important to keep your scheduled follow-up appointments. At these appointments, your caregiver will be checking to make sure that you are healing physically and emotionally. SEEK MEDICAL CARE IF:   You are passing large clots from your vagina. Save any clots to show your caregiver.  You have a foul smelling discharge from your vagina.  You have trouble urinating.  You are urinating frequently.  You have pain when you urinate.  You have a change in your bowel movements.  You have increasing redness, pain, or swelling near your vaginal incision (episiotomy) or vaginal tear.  You have pus draining from your episiotomy or vaginal tear.  Your episiotomy or vaginal tear is separating.  You have painful, hard, or reddened breasts.  You have a severe headache.  You have blurred vision or see spots.  You feel sad or depressed.  You have thoughts of hurting yourself or your newborn.  You have questions about your care, the care of your newborn, or medicines.  You are dizzy or light-headed.  You have a rash.  You have nausea or vomiting.  You were breastfeeding and have not had a menstrual period within 12 weeks after you stopped breastfeeding.  You are not breastfeeding and have not had a menstrual period by the 12th week after delivery.  You have a fever. SEEK IMMEDIATE MEDICAL CARE IF:   You have persistent pain.  You have chest pain.  You have shortness of breath.    You faint.  You have leg pain.  You have stomach pain.  Your vaginal bleeding saturates two or more sanitary pads in 1 hour. MAKE SURE YOU:   Understand these instructions.  Will watch your condition.  Will get help right away if you are not doing well or get worse. Document Released:  04/30/2000 Document Revised: 09/17/2013 Document Reviewed: 12/29/2011 ExitCare Patient Information 2015 [employer]. This information is not intended to replace advice given to you by your health care provider. Make sure you discuss any questions you have with your health care provider.  Sitz Bath A sitz bath is a warm water bath taken in the sitting position. The water covers only the hips and butt (buttocks). We recommend using one that fits in the toilet, to help with ease of use and cleanliness. It may be used for either healing or cleaning purposes. Sitz baths are also used to relieve pain, itching, or muscle tightening (spasms). The water may contain medicine. Moist heat will help you heal and relax.  HOME CARE  Take 3 to 4 sitz baths a day. 18. Fill the bathtub half-full with warm water. 19. Sit in the water and open the drain a little. 20. Turn on the warm water to keep the tub half-full. Keep the water running constantly. 21. Soak in the water for 15 to 20 minutes. 22. After the sitz bath, pat the affected area dry. GET HELP RIGHT AWAY IF: You get worse instead of better. Stop the sitz baths if you get worse. MAKE SURE YOU:  Understand these instructions.  Will watch your condition.  Will get help right away if you are not doing well or get worse. Document Released: 06/10/2004 Document Revised: 01/26/2012 Document Reviewed: 08/31/2010 ExitCare Patient Information 2015 [employer]. This information is not intended to replace advice given to you by your health care provider. Make sure you discuss any questions you have with your health care provider.    

## 2015-05-09 NOTE — Final Progress Note (Signed)
Discharge instructions complete and prescriptions given. Patient verbalizes understanding of teaching. I informed patient of being discharged in a wheelchair, however, patient refused to wait for wheelchair. She and the FOB walked off unit at 1140. Infant left in FOB's arms.

## 2015-05-09 NOTE — Progress Notes (Signed)
Daily Post Partum Note  Meredith Riggs is a 29 y.o. [id number] PPD#1 s/p  VBAC  @ [redacted]w[redacted]d.  Pregnancy c/b h/o c-section, HSV and BMI 31. Patient had PPH this pregnancy  24hr/overnight events:  none  Subjective:  Doing well. Meeting all PP goals and no s/s of anemia  Objective:   Filed Vitals:   05/08/15 2355 05/09/15 0350 05/09/15 0700 05/09/15 0835  BP: 93/51 112/64  99/58  Pulse: 80 86  78  Temp:    97.7 F (36.5 C)  TempSrc:    Oral  Resp: Height:    (1.727 m)   Weight:   96.163 kg (212 lb)   SpO2: 100%   100%    Temp:  [90 F (32.2 C)-97.8 F (36.6 C)] 97.7 F (36.5 C) (12/23 0835) Pulse Rate:  [78-119] 78 (12/23 0835) Resp:  [18-20] 18 (12/23 0835) BP: (88-112)/(51-73) 99/58 mmHg (12/23 0835) SpO2:  [99 %-100 %] 100 % (12/23 0835) Weight:  [96.163 kg (212 lb)] 96.163 kg (212 lb) (12/23 0700) I/O last 3 completed shifts: In: 2245.8 [I.V.:1895.8; Other:350] Out: 2400 [Urine:1700; Blood:700]    Intake/Output Summary (Last 24 hours) at 05/09/15 4540 Last data filed at 05/08/15 2030  Gross per 24 hour  Intake  162.5 ml  Output   1700 ml  Net -1537.5 ml    General: NAD Abdomen: FF below the umbilicus, nttp, nd Perineum: deferred Skin:  Warm and dry.  Cardiovascular: Regular rate and rhythm. Respiratory:  Clear to auscultation bilateral. Normal respiratory effort Extremities: no c/c/e  Medications Current Facility-Administered Medications  Medication Dose Route Frequency Provider Last Rate Last Dose  . benzocaine-Menthol (DERMOPLAST) 20-0.5 % topical spray 1 application  1 application Topical PRN Farrel Conners, CNM   1 application at 05/08/15 1512  . witch hazel-glycerin (TUCKS) pad 1 application  1 application Topical PRN Farrel Conners, CNM       And  . dibucaine (NUPERCAINAL) 1 % rectal ointment 1 application  1 application Rectal PRN Farrel Conners, CNM      . docusate sodium (COLACE) capsule 100 mg  100 mg Oral Daily Farrel Conners, CNM   100 mg at 05/09/15 9811  . ferrous sulfate tablet 325 mg  325 mg Oral Q breakfast Farrel Conners, CNM   325 mg at 05/09/15 9147  . ibuprofen (ADVIL,MOTRIN) tablet 600 mg  600 mg Oral 4 times per day Farrel Conners, CNM   600 mg at 05/09/15 0711  . lactated ringers infusion   Intravenous Continuous Farrel Conners, CNM      . lanolin ointment   Topical PRN Farrel Conners, CNM      . ondansetron (ZOFRAN) tablet 4 mg  4 mg Oral Q4H PRN Farrel Conners, CNM       Or  . ondansetron (ZOFRAN) injection 4 mg  4 mg Intravenous Q4H PRN Farrel Conners, CNM      . oxyCODONE-acetaminophen (PERCOCET/ROXICET) 5-325 MG per tablet 1 tablet  1 tablet Oral Q4H PRN Farrel Conners, CNM      . oxyCODONE-acetaminophen (PERCOCET/ROXICET) 5-325 MG per tablet 2 tablet  2 tablet Oral Q4H PRN Farrel Conners, CNM   2 tablet at 05/09/15 0909  . oxytocin (PITOCIN) IV infusion 40 units in LR 1000 mL  62.5 mL/hr Intravenous Continuous PRN Farrel Conners, CNM      . prenatal multivitamin tablet 1 tablet  1 tablet Oral Q1200 Farrel Conners, CNM   1 tablet at 05/08/15 1511  .  simethicone (MYLICON) chewable tablet 80 mg  80 mg Oral PRN Farrel Connersolleen Gutierrez, CNM      . Tdap (BOOSTRIX) injection 0.5 mL  0.5 mL Intramuscular Once Farrel ConnersColleen Gutierrez, CNM        Labs:   Recent Labs Lab 05/07/15 2107 05/08/15 1241 05/09/15 0653  WBC 17.2* 23.4* 16.8*  HGB 12.0 8.4* 7.6*  HCT 36.5 25.7* 23.2*  PLT 163 136* 127*   No results for input(s): NA, K, CL, CO2, BUN, CREATININE, LABGLOM, GLUCOSE, CALCIUM in the last 168 hours.  Assessment & Plan:  Pt doing well *Postpartum/postop: routine care * Anemia: continue with iron. Recheck CBC at [facility] *Dispo: home today  Cornelia Copaharlie Tareka Jhaveri, Jr. MD [facility] Pager [phone]

## 2015-05-10 ENCOUNTER — Inpatient Hospital Stay: Admission: RE | Admit: 2015-05-10 | Payer: Medicaid Other | Source: Ambulatory Visit

## 2015-09-19 ENCOUNTER — Ambulatory Visit (INDEPENDENT_AMBULATORY_CARE_PROVIDER_SITE_OTHER): Payer: Self-pay | Admitting: Surgery

## 2015-09-19 ENCOUNTER — Encounter: Payer: Self-pay | Admitting: Surgery

## 2015-09-19 VITALS — BP 128/86 | HR 70 | Temp 97.8°F | Ht 68.0 in | Wt 203.0 lb

## 2015-09-19 DIAGNOSIS — L732 Hidradenitis suppurativa: Secondary | ICD-10-CM

## 2015-09-19 NOTE — Progress Notes (Signed)
  Surgical Consultation  09/19/2015  Meredith Riggs is an 30 y.o. female.   CC: Axillary mass, left  HPI: This patient with several year history of axillary mass which is growing and giving her difficulty and shaving has been diagnosed as hidradenitis or a hidradenoma via punch biopsy. It has been lanced once and never closed and it is been recently biopsied. It causes her drainage and difficulty with hygiene.  Past Medical History  Diagnosis Date  . Vaginal Pap smear, abnormal     Pt. don't know when the last one was abn.   Marland Kitchen. Herpes genitalis     Past Surgical History  Procedure Laterality Date  . Cesarean section      History reviewed. No pertinent family history.  Social History:  reports that she has never smoked. She does not have any smokeless tobacco history on file. She reports that she does not drink alcohol or use illicit drugs.  Allergies: No Known Allergies  Medications reviewed.   Review of Systems:   Review of Systems  Constitutional: Negative for fever and chills.  HENT: Negative.   Eyes: Negative.   Respiratory: Negative.   Cardiovascular: Negative.   Gastrointestinal: Negative.   Genitourinary: Negative.   Musculoskeletal: Negative.   Skin: Negative.   Neurological: Negative.   Endo/Heme/Allergies: Negative.   Psychiatric/Behavioral: Negative.      Physical Exam:  BP 128/86 mmHg  Pulse 70  Temp(Src) 97.8 F (36.6 C) (Oral)  Ht 5\' 8"  (1.727 m)  Wt 203 lb (92.08 kg)  BMI 30.87 kg/m2  Physical Exam  Constitutional: She is oriented to person, place, and time and well-developed, well-nourished, and in no distress. No distress.  HENT:  Head: Normocephalic and atraumatic.  Eyes: Pupils are equal, round, and reactive to light. Right eye exhibits no discharge. Left eye exhibits no discharge. No scleral icterus.  Neck: Normal range of motion.  Cardiovascular: Normal rate, regular rhythm and normal heart sounds.   Pulmonary/Chest: Effort normal  and breath sounds normal. No respiratory distress. She has no wheezes. She has no rales.  Abdominal: Soft. She exhibits no distension. There is no tenderness. There is no rebound.  Musculoskeletal: Normal range of motion.  Axillary mass Left  5x8cm draining, nonerythematous  Lymphadenopathy:    She has no cervical adenopathy.  Neurological: She is alert and oriented to person, place, and time.  Skin: Skin is warm and dry. No rash noted. She is not diaphoretic. No erythema.  Psychiatric: Mood and affect normal.  Vitals reviewed.     No results found for this or any previous visit (from the past 48 hour(s)). No results found.  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 Recommend excision. The rationale for this discussed the options of observation reviewed the risk bleeding infection recurrence and the high likelihood of recurrence were all reviewed she understood and agreed to proceed  Lattie Hawichard E Preslei Blakley, MD, FACS

## 2015-09-19 NOTE — Patient Instructions (Signed)

## 2015-09-22 ENCOUNTER — Telehealth: Payer: Self-pay | Admitting: Surgery

## 2015-09-22 NOTE — Telephone Encounter (Signed)
I have called Pt to advise her of pre op date/time and sx date. No answer and I was not able to leave a message due to no voicemail being set up.  Sx: 10/15/15 with Dr Sinda Duooper--Left excision hidradenitis of Axilla  Pre op: 10/07/15 @ 9:00am--Office.   Patient made aware to call [phone], between 1-3:00pm the day before surgery, to find out what time to arrive.

## 2015-09-23 NOTE — Telephone Encounter (Signed)
I have called patient to discuss surgery date. No answer. i was not able to leave a voicemail. I have called the emergency contact and no answer. I was able to leave a message to please advise patient to call our office.

## 2015-09-25 NOTE — Telephone Encounter (Signed)
Patient has called back and was given all surgery/pre op information below. Patient verbalized understanding.

## 2015-10-07 ENCOUNTER — Encounter
Admission: RE | Admit: 2015-10-07 | Discharge: 2015-10-07 | Disposition: A | Discharge status: Home / Self Care | Payer: Managed Care, Other (non HMO) | Source: Ambulatory Visit | Attending: Surgery | Admitting: Surgery

## 2015-10-07 DIAGNOSIS — Z01812 Encounter for preprocedural laboratory examination: Secondary | ICD-10-CM | POA: Insufficient documentation

## 2015-10-07 LAB — CBC WITH DIFFERENTIAL/PLATELET
Basophils Absolute: 0 10*3/uL (ref 0–0.1)
Basophils Relative: 0 %
EOS ABS: 0.1 10*3/uL (ref 0–0.7)
HCT: 38 % (ref 35.0–47.0)
Hemoglobin: 12.2 g/dL (ref 12.0–16.0)
LYMPHS ABS: 2.4 10*3/uL (ref 1.0–3.6)
Lymphocytes Relative: 28 %
MCH: 28.7 pg (ref 26.0–34.0)
MCHC: 32.2 g/dL (ref 32.0–36.0)
MCV: 88.9 fL (ref 80.0–100.0)
Monocytes Absolute: 0.6 10*3/uL (ref 0.2–0.9)
Neutro Abs: 5.5 10*3/uL (ref 1.4–6.5)
Neutrophils Relative %: 64 %
PLATELETS: 223 10*3/uL (ref 150–440)
RBC: 4.27 MIL/uL (ref 3.80–5.20)
RDW: 15.5 % — AB (ref 11.5–14.5)
WBC: 8.6 10*3/uL (ref 3.6–11.0)

## 2015-10-07 LAB — BASIC METABOLIC PANEL
Anion gap: 6 (ref 5–15)
BUN: 12 mg/dL (ref 6–20)
CHLORIDE: 105 mmol/L (ref 101–111)
CO2: 27 mmol/L (ref 22–32)
CREATININE: 0.84 mg/dL (ref 0.44–1.00)
Calcium: 9.1 mg/dL (ref 8.9–10.3)
GFR calc Af Amer: >60 mL/min (ref >60)
GFR calc non Af Amer: >60 mL/min (ref >60)
Glucose, Bld: 80 mg/dL (ref 65–99)
Potassium: 3.5 mmol/L (ref 3.5–5.1)
SODIUM: 138 mmol/L (ref 135–145)

## 2015-10-07 NOTE — Patient Instructions (Signed)
  Your procedure is scheduled on:5/31 Report to Day Surgery. To find out your arrival time please call [phone] between 1PM - 3PM on Tues. 5/30  Remember: Instructions that are not followed completely may result in serious medical risk, up to and including death, or upon the discretion of your surgeon and anesthesiologist your surgery may need to be rescheduled.    __x__ 1. Do not eat food or drink liquids after midnight. No gum chewing or hard candies.     __x__ 2. No Alcohol for 24 hours before or after surgery.   ____ 3. Bring all medications with you on the day of surgery if instructed.    __x__ 4. Notify your doctor if there is any change in your medical condition     (cold, fever, infections).     Do not wear jewelry, make-up, hairpins, clips or nail polish.  Do not wear lotions, powders, or perfumes. You may wear deodorant.  Do not shave 48 hours prior to surgery. Men may shave face and neck.  Do not bring valuables to the hospital.    [facility] not [facility] for any belongings or valuables.               Contacts, dentures or bridgework may not be worn into surgery.  Leave your suitcase in the car. After surgery it may be brought to your room.  For patients admitted to the hospital, discharge time is determined by your                treatment team.   Patients discharged the day of surgery will not be allowed to drive home.   Please read over the following fact sheets that you were given:   Surgical Site Infection Prevention   ____ Take these medicines the morning of surgery with A SIP OF WATER:    1.   2.   3.   4.  5.  6.  ____ Fleet Enema (as directed)   __x__ Use CHG Soap as directed  ____ Use inhalers on the day of surgery  ____ Stop metformin 2 days prior to surgery    ____ Take 1/2 of usual insulin dose the night before surgery and none on the morning of surgery.   ____ Stop Coumadin/Plavix/aspirin on   __x__ Stop Anti-inflammatories on  Tylenol only until after surgery   ____ Stop supplements until after surgery.    ____ Bring C-Pap to the hospital.

## 2015-10-15 ENCOUNTER — Encounter
Admission: RE | Disposition: A | Discharge status: Home / Self Care | Payer: Self-pay | Source: Ambulatory Visit | Attending: Surgery

## 2015-10-15 ENCOUNTER — Encounter: Payer: Self-pay | Admitting: *Deleted

## 2015-10-15 ENCOUNTER — Ambulatory Visit
Admission: RE | Admit: 2015-10-15 | Discharge: 2015-10-15 | Disposition: A | Discharge status: Home / Self Care | Payer: Managed Care, Other (non HMO) | Source: Ambulatory Visit | Attending: Surgery | Admitting: Surgery

## 2015-10-15 ENCOUNTER — Ambulatory Visit: Payer: Managed Care, Other (non HMO) | Admitting: Anesthesiology

## 2015-10-15 DIAGNOSIS — L732 Hidradenitis suppurativa: Secondary | ICD-10-CM | POA: Diagnosis not present

## 2015-10-15 HISTORY — PX: HYDRADENITIS EXCISION: SHX5243

## 2015-10-15 LAB — POCT PREGNANCY, URINE: PREG TEST UR: NEGATIVE

## 2015-10-15 SURGERY — EXCISION, HIDRADENITIS, AXILLA
Anesthesia: General | Laterality: Left | Wound class: Clean Contaminated

## 2015-10-15 MED ORDER — CHLORHEXIDINE GLUCONATE 4 % EX LIQD
1.0000 "application " | Freq: Once | CUTANEOUS | Status: AC
  Administered 2015-10-15: 1 via TOPICAL

## 2015-10-15 MED ORDER — HEPARIN SODIUM (PORCINE) 5000 UNIT/ML IJ SOLN
5000.0000 [IU] | Freq: Once | INTRAMUSCULAR | Status: AC
  Administered 2015-10-15: 5000 [IU] via SUBCUTANEOUS

## 2015-10-15 MED ORDER — LACTATED RINGERS IV SOLN
INTRAVENOUS | Status: DC
  Administered 2015-10-15: 09:00:00 via INTRAVENOUS

## 2015-10-15 MED ORDER — FENTANYL CITRATE (PF) 100 MCG/2ML IJ SOLN
INTRAMUSCULAR | Status: DC | PRN
  Administered 2015-10-15: 50 ug via INTRAVENOUS

## 2015-10-15 MED ORDER — MIDAZOLAM HCL 2 MG/2ML IJ SOLN
INTRAMUSCULAR | Status: DC | PRN
  Administered 2015-10-15: 2 mg via INTRAVENOUS

## 2015-10-15 MED ORDER — ONDANSETRON HCL 4 MG/2ML IJ SOLN
INTRAMUSCULAR | Status: DC | PRN
  Administered 2015-10-15: 4 mg via INTRAVENOUS

## 2015-10-15 MED ORDER — BUPIVACAINE-EPINEPHRINE (PF) 0.25% -1:200000 IJ SOLN
INTRAMUSCULAR | Status: DC | PRN
  Administered 2015-10-15: 16 mL

## 2015-10-15 MED ORDER — HYDROCODONE-ACETAMINOPHEN 5-325 MG PO TABS
1.0000 | ORAL_TABLET | Freq: Once | ORAL | Status: AC
  Administered 2015-10-15: 1 via ORAL

## 2015-10-15 MED ORDER — DEXAMETHASONE SODIUM PHOSPHATE 10 MG/ML IJ SOLN
INTRAMUSCULAR | Status: DC | PRN
  Administered 2015-10-15: 5 mg via INTRAVENOUS

## 2015-10-15 MED ORDER — KETOROLAC TROMETHAMINE 30 MG/ML IJ SOLN
INTRAMUSCULAR | Status: DC | PRN
  Administered 2015-10-15: 15 mg via INTRAVENOUS

## 2015-10-15 MED ORDER — PROPOFOL 10 MG/ML IV BOLUS
INTRAVENOUS | Status: DC | PRN
  Administered 2015-10-15: 150 mg via INTRAVENOUS

## 2015-10-15 MED ORDER — ONDANSETRON HCL 4 MG/2ML IJ SOLN: 4.0000 mg | Freq: Once | INTRAMUSCULAR | Status: DC | PRN

## 2015-10-15 MED ORDER — LIDOCAINE HCL (CARDIAC) 20 MG/ML IV SOLN
INTRAVENOUS | Status: DC | PRN
  Administered 2015-10-15: 30 mg via INTRAVENOUS

## 2015-10-15 MED ORDER — FAMOTIDINE 20 MG PO TABS
20.0000 mg | ORAL_TABLET | Freq: Once | ORAL | Status: AC
  Administered 2015-10-15: 20 mg via ORAL

## 2015-10-15 MED ORDER — CEFAZOLIN SODIUM-DEXTROSE 2-4 GM/100ML-% IV SOLN
2.0000 g | INTRAVENOUS | Status: AC
  Administered 2015-10-15: 2 g via INTRAVENOUS

## 2015-10-15 MED ORDER — FENTANYL CITRATE (PF) 100 MCG/2ML IJ SOLN: 25.0000 ug | INTRAMUSCULAR | Status: DC | PRN

## 2015-10-15 MED ORDER — HYDROCODONE-ACETAMINOPHEN 5-300 MG PO TABS: 1.0000 | ORAL_TABLET | ORAL | Status: DC | PRN

## 2015-10-15 SURGICAL SUPPLY — 21 items
ADH LQ OCL WTPRF AMP STRL LF (MISCELLANEOUS) ×1
ADHESIVE MASTISOL STRL (MISCELLANEOUS) ×3 IMPLANT
CANISTER SUCT 1200ML W/VALVE (MISCELLANEOUS) ×3 IMPLANT
CLOSURE WOUND 1/2 X4 (GAUZE/BANDAGES/DRESSINGS)
DRAPE LAPAROTOMY 100X77 ABD (DRAPES) ×3 IMPLANT
ELECT REM PT RETURN 9FT ADLT (ELECTROSURGICAL) ×3
ELECTRODE REM PT RTRN 9FT ADLT (ELECTROSURGICAL) ×1 IMPLANT
GAUZE SPONGE 4X4 12PLY STRL (GAUZE/BANDAGES/DRESSINGS) ×3 IMPLANT
GLOVE BIO SURGEON STRL SZ8 (GLOVE) ×13 IMPLANT
GOWN STRL REUS W/ TWL LRG LVL3 (GOWN DISPOSABLE) ×2 IMPLANT
GOWN STRL REUS W/TWL LRG LVL3 (GOWN DISPOSABLE) ×12
JACKSON PRATT 10 (INSTRUMENTS) ×1 IMPLANT
KIT RM TURNOVER STRD PROC AR (KITS) ×3 IMPLANT
LABEL OR SOLS (LABEL) ×3 IMPLANT
PACK BASIN MINOR ARMC (MISCELLANEOUS) ×3 IMPLANT
STRIP CLOSURE SKIN 1/2X4 (GAUZE/BANDAGES/DRESSINGS) ×1 IMPLANT
SUT ETH BLK MONO 3 0 FS 1 12/B (SUTURE) ×1 IMPLANT
SUT MNCRL AB 4-0 PS2 18 (SUTURE) ×3 IMPLANT
SUT VIC AB 3-0 SH 27 (SUTURE) ×6
SUT VIC AB 3-0 SH 27X BRD (SUTURE) ×1 IMPLANT
WATER STERILE IRR 1000ML POUR (IV SOLUTION) ×3 IMPLANT

## 2015-10-15 NOTE — Anesthesia Procedure Notes (Signed)
Procedure Name: LMA Insertion Date/Time: 10/15/2015 8:56 AM Performed by: Omer JackWEATHERLY, Meredith Minney Pre-anesthesia Checklist: Patient identified, Patient being monitored, Timeout performed, Emergency Drugs available and Suction available Patient Re-evaluated:Patient Re-evaluated prior to inductionOxygen Delivery Method: Circle system utilized Preoxygenation: Pre-oxygenation with 100% oxygen Intubation Type: IV induction Ventilation: Mask ventilation without difficulty LMA: LMA inserted LMA Size: 4.0 Tube type: Oral Number of attempts: 1 Placement Confirmation: positive ETCO2 and breath sounds checked- equal and bilateral Tube secured with: Tape Dental Injury: Teeth and Oropharynx as per pre-operative assessment

## 2015-10-15 NOTE — Op Note (Signed)
10/15/2015  9:29 AM  PATIENT:  Ardell IsaacsIeshia D Schlottman  30 y.o. female  PRE-OPERATIVE DIAGNOSIS:  Left axillary hidradenitis  POST-OPERATIVE DIAGNOSIS:  Same  PROCEDURE: Excision of left axillary hidradenitis, complex  SURGEON:  Lattie Hawichard E Gabor Lusk MD, FACS   ANESTHESIA:   Gen. 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  Patient was induced general anesthesia and prepped draped sterile fashion a surgical post was performed. A lenticular shaped incision was drawn out to encompass the chronic drainage area and the majority of the hidradenitis in the left axilla. Static was infiltrated in the skin and subcutaneous tissues tissues and then the lenticular shaped incision was executed with sharp dissection and minimal electrocautery dissection. Hemostasis was with electrocautery up. The specimen was sent for examination. Wound was then again checked for hemostasis and found to be adequate. Deep sutures of 3-0 Vicryl were placed in multiple layers followed by 40 septic or Monocryl Steri-Strips Mastisol and sterile dressings were placed  Patient tolerated the procedure well the workup occasions she was taken to recovery room in stable condition to be discharged care of her family follow-up in 10 days   Lattie Hawichard E Cambria Osten, MD FACS

## 2015-10-15 NOTE — Progress Notes (Signed)
Preoperative Review   Patient is met in the preoperative holding area. The history is reviewed in the chart and with the patient. I personally reviewed the options and rationale as well as the risks of this procedure that have been previously discussed with the patient. All questions asked by the patient and/or family were answered to their satisfaction.  Patient agrees to proceed with this procedure at this time.  Florene Glen M.D. FACS

## 2015-10-15 NOTE — Transfer of Care (Signed)
Immediate Anesthesia Transfer of Care Note  Patient: Meredith Riggs  Procedure(s) Performed: Procedure(s): EXCISION HIDRADENITIS AXILLA (Left)  Patient Location: PACU  Anesthesia Type:General  Level of Consciousness: sedated  Airway & Oxygen Therapy: Patient Spontanous Breathing  Post-op Assessment: Report given to RN  Post vital signs: stable  Last Vitals:  Filed Vitals:   10/15/15 0824  BP: 115/78  Pulse: 84  Temp: 36.8 C  Resp: 16    Last Pain:  Filed Vitals:   10/15/15 0825  PainSc: 5          Complications: No apparent anesthesia complications

## 2015-10-15 NOTE — Anesthesia Postprocedure Evaluation (Signed)
Anesthesia Post Note  Patient: Meredith Riggs  Procedure(s) Performed: Procedure(s) (LRB): EXCISION HIDRADENITIS AXILLA (Left)  Patient location during evaluation: PACU Anesthesia Type: General Level of consciousness: awake and alert Pain management: pain level controlled Vital Signs Assessment: post-procedure vital signs reviewed and stable Respiratory status: spontaneous breathing and respiratory function stable Cardiovascular status: blood pressure returned to baseline and stable Anesthetic complications: no    Last Vitals:  Filed Vitals:   10/15/15 0824 10/15/15 0935  BP: 115/78 130/83  Pulse: 84   Temp: 36.8 C 36.6 C  Resp: 16     Last Pain:  Filed Vitals:   10/15/15 0939  PainSc: 0-No pain                 Jahmia Berrett K

## 2015-10-15 NOTE — Anesthesia Preprocedure Evaluation (Addendum)
Anesthesia Evaluation  Patient identified by MRN, date of birth, ID band Patient awake    Reviewed: Allergy & Precautions, NPO status , Patient's Chart, lab work & pertinent test results  History of Anesthesia Complications Negative for: history of anesthetic complications  Airway Mallampati: II       Dental   Pulmonary neg pulmonary ROS,           Cardiovascular negative cardio ROS       Neuro/Psych negative neurological ROS     GI/Hepatic negative GI ROS, Neg liver ROS,   Endo/Other  negative endocrine ROS  Renal/GU negative Renal ROS     Musculoskeletal   Abdominal   Peds  Hematology negative hematology ROS (+)   Anesthesia Other Findings   Reproductive/Obstetrics                             Anesthesia Physical Anesthesia Plan  ASA: II  Anesthesia Plan: General   Post-op Pain Management:    Induction: Intravenous  Airway Management Planned: LMA  Additional Equipment:   Intra-op Plan:   Post-operative Plan:   Informed Consent: I have reviewed the patients History and Physical, chart, labs and discussed the procedure including the risks, benefits and alternatives for the proposed anesthesia with the patient or authorized representative who has indicated his/her understanding and acceptance.     Plan Discussed with:   Anesthesia Plan Comments:         Anesthesia Quick Evaluation  

## 2015-10-15 NOTE — Discharge Instructions (Signed)
Remove dressing in 24 hours. °May shower in 24 hours. °Leave paper strips in place. °Resume all home medications. °Follow-up with Dr. Cooper in 10 days. ° °AMBULATORY SURGERY  °DISCHARGE INSTRUCTIONS ° ° °1) The drugs that you were given will stay in your system until tomorrow so for the next 24 hours you should not: ° °A) Drive an automobile °B) Make any legal decisions °C) Drink any alcoholic beverage ° ° °2) You may resume regular meals tomorrow.  Today it is better to start with liquids and gradually work up to solid foods. ° °You may eat anything you prefer, but it is better to start with liquids, then soup and crackers, and gradually work up to solid foods. ° ° °3) Please notify your doctor immediately if you have any unusual bleeding, trouble breathing, redness and pain at the surgery site, drainage, fever, or pain not relieved by medication. ° ° ° °4) Additional Instructions: ° ° ° ° ° ° ° °Please contact your physician with any problems or Same Day Surgery at [phone], Monday through Friday 6 am to 4 pm, or [place] [facility] [place] Main number at [phone]. °

## 2015-10-20 LAB — SURGICAL PATHOLOGY

## 2015-10-24 ENCOUNTER — Ambulatory Visit: Payer: Self-pay | Admitting: Surgery

## 2015-11-06 ENCOUNTER — Encounter (INDEPENDENT_AMBULATORY_CARE_PROVIDER_SITE_OTHER): Payer: Self-pay

## 2015-11-06 ENCOUNTER — Encounter: Payer: Self-pay | Admitting: Surgery

## 2015-11-06 ENCOUNTER — Ambulatory Visit (INDEPENDENT_AMBULATORY_CARE_PROVIDER_SITE_OTHER): Payer: Managed Care, Other (non HMO) | Admitting: Surgery

## 2015-11-06 VITALS — BP 113/74 | HR 77 | Temp 96.9°F | Ht 68.0 in | Wt 205.0 lb

## 2015-11-06 DIAGNOSIS — L732 Hidradenitis suppurativa: Secondary | ICD-10-CM

## 2015-11-06 NOTE — Progress Notes (Signed)
Outpatient postop visit  11/06/2015  Meredith Riggs is an 30 y.o. female.    Procedure: Excision of left axillary hidradenitis  CC: Drainage  HPI: This patient status post excision of left axillary hidradenitis. All edges reviewed showing a benign hidradenoma. Patient had drainage on Friday but none since he feels a hard area beneath the incision and still has some discomfort and pain  Medications reviewed.    Physical Exam:  LMP 10/03/2015    PE: Wound is completely healed there is no erythema no drainage no expressible purulence or serous fluid and no sign of the area where had drained last week. There is induration but no erythema to speak of.    Assessment/Plan:  This patient status post excision of a left axillary hidradenoma for hidradenitis. The pathology is personally reviewed. Patient doing very well postop I see no sign of infection at this point I reminded her that this could recur as not all of the tissue could be removed from her axilla. She will return on an as-needed basis especially if she has more drainage in the future.  Lattie Hawichard E Cooper, MD, FACS

## 2015-11-06 NOTE — Patient Instructions (Signed)
Please call our office with any questions or concerns. 

## 2016-11-16 ENCOUNTER — Ambulatory Visit: Payer: Self-pay | Admitting: Obstetrics & Gynecology

## 2017-01-15 ENCOUNTER — Emergency Department
Admission: EM | Admit: 2017-01-15 | Discharge: 2017-01-15 | Disposition: A | Discharge status: Home / Self Care | Payer: Managed Care, Other (non HMO) | Attending: Emergency Medicine | Admitting: Emergency Medicine

## 2017-01-15 ENCOUNTER — Encounter: Payer: Self-pay | Admitting: Emergency Medicine

## 2017-01-15 DIAGNOSIS — Y939 Activity, unspecified: Secondary | ICD-10-CM | POA: Insufficient documentation

## 2017-01-15 DIAGNOSIS — Y999 Unspecified external cause status: Secondary | ICD-10-CM | POA: Insufficient documentation

## 2017-01-15 DIAGNOSIS — Y929 Unspecified place or not applicable: Secondary | ICD-10-CM | POA: Insufficient documentation

## 2017-01-15 DIAGNOSIS — W57XXXA Bitten or stung by nonvenomous insect and other nonvenomous arthropods, initial encounter: Secondary | ICD-10-CM | POA: Insufficient documentation

## 2017-01-15 DIAGNOSIS — S1096XA Insect bite of unspecified part of neck, initial encounter: Secondary | ICD-10-CM | POA: Insufficient documentation

## 2017-01-15 MED ORDER — TRAMADOL HCL 50 MG PO TABS: 50.0000 mg | ORAL_TABLET | Freq: Four times a day (QID) | ORAL | 0 refills | Status: AC | PRN

## 2017-01-15 MED ORDER — NAPROXEN 500 MG PO TABS: 500.0000 mg | ORAL_TABLET | Freq: Two times a day (BID) | ORAL | Status: DC

## 2017-01-15 MED ORDER — SULFAMETHOXAZOLE-TRIMETHOPRIM 800-160 MG PO TABS: 1.0000 | ORAL_TABLET | Freq: Two times a day (BID) | ORAL | 0 refills | Status: DC

## 2017-01-15 NOTE — ED Triage Notes (Signed)
Pt states that she was bit on the back of her neck by something yesterday. Pt states that the area is painful. Pt in NAD at this time.

## 2017-01-15 NOTE — ED Provider Notes (Signed)
[facility] Emergency Department Provider Note   ____________________________________________   First MD Initiated Contact with Patient 01/15/17 1316     (approximate)  I have reviewed the triage vital signs and the nursing notes.   HISTORY  Chief Complaint Insect Bite    HPI Meredith Riggs is a 31 y.o. female patient complaining of pain posterior neck for 2 days. Patient state yesterday morning she was bitten by an unknown insect. Patient denies nausea, vomiting, fever with this complaint. Patient say pain increased this morning. Patient rates the pain as a 6/10.Patient described a pain as "achy". No palliative measures for complaint.   Past Medical History:  Diagnosis Date  . Herpes genitalis   . Vaginal Pap smear, abnormal    Pt. don't know when the last one was abn.     Patient Active Problem List   Diagnosis Date Noted  . Hidradenitis axillaris   . Indication for care in labor or delivery 05/07/2015  . Labor and delivery, indication for care 05/05/2015  . Vaginal pain 04/11/2015  . Nexplanon removal 11/29/2013    Past Surgical History:  Procedure Laterality Date  . CESAREAN SECTION    . HYDRADENITIS EXCISION Left 10/15/2015   Procedure: EXCISION HIDRADENITIS AXILLA;  Surgeon: Lattie Hawichard E Cooper, MD;  Location: [facility] ORS;  Service: General;  Laterality: Left;  . INDUCED ABORTION      Prior to Admission medications   Medication Sig Start Date End Date Taking? Authorizing Provider  naproxen (NAPROSYN) 500 MG tablet Take 1 tablet (500 mg total) by mouth 2 (two) times daily with a meal. 01/15/17   Joni ReiningSmith, Malayjah Otoole K, PA-C  sulfamethoxazole-trimethoprim (BACTRIM DS,SEPTRA DS) 800-160 MG tablet Take 1 tablet by mouth 2 (two) times daily. 01/15/17   Joni ReiningSmith, Maxximus Gotay K, PA-C  traMADol (ULTRAM) 50 MG tablet Take 1 tablet (50 mg total) by mouth every 6 (six) hours as needed. 01/15/17 01/15/18  Joni ReiningSmith, Cade Dashner K, PA-C    Allergies Patient has no known  allergies.  Family History  Problem Relation Age of Onset  . Cancer Maternal Uncle        oral  . Diabetes Neg Hx   . Heart disease Neg Hx   . Hypertension Neg Hx     Social History Social History  Substance Use Topics  . Smoking status: Never Smoker  . Smokeless tobacco: Never Used  . Alcohol use No    Review of Systems  Constitutional: No fever/chills Eyes: No visual changes. ENT: No sore throat. Cardiovascular: Denies chest pain. Respiratory: Denies shortness of breath. Gastrointestinal: No abdominal pain.  No nausea, no vomiting.  No diarrhea.  No constipation. Genitourinary: Negative for dysuria. Musculoskeletal: Negative for back pain. Skin: Negative for rash. Swelling posterior neck Neurological: Negative for headaches, focal weakness or numbness.   ____________________________________________   PHYSICAL EXAM:  VITAL SIGNS: ED Triage Vitals [01/15/17 1255]  Enc Vitals Group     BP 134/79     Pulse Rate 97     Resp 16     Temp 99.4 F (37.4 C)     Temp Source Oral     SpO2 98 %     Weight 205 lb (93 kg)     Height      Head Circumference      Peak Flow      Pain Score 6     Pain Loc      Pain Edu?      Excl. in GC?  Constitutional: Alert and oriented. Well appearing and in no acute distress.   Oropharynx non-erythematous. Neck: No stridor.  Hematological/Lymphatic/Immunilogical: No cervical lymphadenopathy. Cardiovascular: Normal rate, regular rhythm. Grossly normal heart sounds.  Good peripheral circulation. Respiratory: Normal respiratory effort.  No retractions. Lungs CTAB. Gastrointestinal: Soft and nontender. No distention. No abdominal bruits. No CVA tenderness. Musculoskeletal: No lower extremity tenderness nor edema.  No joint effusions. Neurologic:  Normal speech and language. No gross focal neurologic deficits are appreciated. No gait instability. Skin:  Skin is warm, dry and intact. No rash noted. Edema and erythema posterior neck.  Moderate guarding palpation posterior neck. Psychiatric: Mood and affect are normal. Speech and behavior are normal.  ____________________________________________   LABS (all labs ordered are listed, but only abnormal results are displayed)  Labs Reviewed - No data to display ____________________________________________  EKG   ____________________________________________  RADIOLOGY  No results found.  ____________________________________________   PROCEDURES  Procedure(s) performed: None  Procedures  Critical Care performed: No  ____________________________________________   INITIAL IMPRESSION / ASSESSMENT AND PLAN / ED COURSE  Pertinent labs & imaging results that were available during my care of the patient were reviewed by me and considered in my medical decision making (see chart for details).  Infected insect bite posterior neck. Patient given discharge care instructions. Patient advised take medication as directed and [facility] no improvement in 3-5 days.    ____________________________________________   FINAL CLINICAL IMPRESSION(S) / ED DIAGNOSES  Final diagnoses:  Bug bite with infection, initial encounter      NEW MEDICATIONS STARTED DURING THIS VISIT:  New Prescriptions   NAPROXEN (NAPROSYN) 500 MG TABLET    Take 1 tablet (500 mg total) by mouth 2 (two) times daily with a meal.   SULFAMETHOXAZOLE-TRIMETHOPRIM (BACTRIM DS,SEPTRA DS) 800-160 MG TABLET    Take 1 tablet by mouth 2 (two) times daily.   TRAMADOL (ULTRAM) 50 MG TABLET    Take 1 tablet (50 mg total) by mouth every 6 (six) hours as needed.     Note:  This document was prepared using Dragon voice recognition software and may include unintentional dictation errors.    Joni Reining, PA-C 01/15/17 1327    Jene Every, MD 01/15/17 [phone]

## 2017-01-15 NOTE — Discharge Instructions (Signed)
Take medication as directed. Apply warm compress to area twice a day for approximately 5-10 minutes. Moderate to area for increased swelling.

## 2017-06-23 ENCOUNTER — Ambulatory Visit: Payer: Self-pay | Admitting: Obstetrics & Gynecology

## 2017-07-20 ENCOUNTER — Encounter: Payer: Self-pay | Admitting: Obstetrics & Gynecology

## 2017-07-20 ENCOUNTER — Ambulatory Visit (INDEPENDENT_AMBULATORY_CARE_PROVIDER_SITE_OTHER): Payer: Self-pay | Admitting: Obstetrics & Gynecology

## 2017-07-20 VITALS — BP 120/80 | Ht 68.0 in | Wt 218.0 lb

## 2017-07-20 DIAGNOSIS — Z0000 Encounter for general adult medical examination without abnormal findings: Secondary | ICD-10-CM

## 2017-07-20 DIAGNOSIS — N898 Other specified noninflammatory disorders of vagina: Secondary | ICD-10-CM

## 2017-07-20 DIAGNOSIS — Z124 Encounter for screening for malignant neoplasm of cervix: Secondary | ICD-10-CM

## 2017-07-20 DIAGNOSIS — Z3046 Encounter for surveillance of implantable subdermal contraceptive: Secondary | ICD-10-CM

## 2017-07-20 DIAGNOSIS — E669 Obesity, unspecified: Secondary | ICD-10-CM

## 2017-07-20 MED ORDER — PHENTERMINE HCL 37.5 MG PO TABS: 37.5000 mg | ORAL_TABLET | Freq: Every day | ORAL | 0 refills | Status: DC

## 2017-07-20 MED ORDER — METRONIDAZOLE 0.75 % VA GEL: 1.0000 | Freq: Every day | VAGINAL | 0 refills | Status: AC

## 2017-07-20 NOTE — Progress Notes (Signed)
HPI:      [name] is a 32 y.o. [id number] who LMP was No LMP recorded., she presents today for her annual examination. The patient has no complaints today. The patient is not currently sexually active. Her last pap: approximate date 2017 and was normal. The patient does perform self breast exams.  There is no notable family history of breast or ovarian cancer in her family.  The patient has regular exercise: yes.  The patient denies current symptoms of depression.    GYN History: Contraception: Nexplanon  PMHx: Past Medical History:  Diagnosis Date  . Herpes genitalis   . Vaginal Pap smear, abnormal    Pt. don't know when the last one was abn.    Past Surgical History:  Procedure Laterality Date  . CESAREAN SECTION    . HYDRADENITIS EXCISION Left 10/15/2015   Procedure: EXCISION HIDRADENITIS AXILLA;  Surgeon: Lattie Hawichard E Cooper, MD;  Location: [facility] ORS;  Service: General;  Laterality: Left;  . INDUCED ABORTION     Family History  Problem Relation Age of Onset  . Cancer Maternal Uncle        oral  . Diabetes Neg Hx   . Heart disease Neg Hx   . Hypertension Neg Hx    Social History   Tobacco Use  . Smoking status: Never Smoker  . Smokeless tobacco: Never Used  Substance Use Topics  . Alcohol use: No  . Drug use: No    Current Outpatient Medications:  .  metroNIDAZOLE (METROGEL) 0.75 % vaginal gel, Place 1 Applicatorful vaginally at bedtime for 5 days., Disp: 25 g, Rfl: 0 .  naproxen (NAPROSYN) 500 MG tablet, Take 1 tablet (500 mg total) by mouth 2 (two) times daily with a meal. (Patient not taking: Reported on 07/20/2017), Disp: 20 tablet, Rfl: 00 .  phentermine (ADIPEX-P) 37.5 MG tablet, Take 1 tablet (37.5 mg total) by mouth daily before breakfast., Disp: 30 tablet, Rfl: 0 .  sulfamethoxazole-trimethoprim (BACTRIM DS,SEPTRA DS) 800-160 MG tablet, Take 1 tablet by mouth 2 (two) times daily. (Patient not taking: Reported on 07/20/2017), Disp: 20 tablet, Rfl: 0 .   traMADol (ULTRAM) 50 MG tablet, Take 1 tablet (50 mg total) by mouth every 6 (six) hours as needed., Disp: 20 tablet, Rfl: 0 Allergies: Patient has no known allergies.  Review of Systems  Constitutional: Negative for chills, fever and malaise/fatigue.  HENT: Negative for congestion, sinus pain and sore throat.   Eyes: Negative for blurred vision and pain.  Respiratory: Negative for cough and wheezing.   Cardiovascular: Negative for chest pain and leg swelling.  Gastrointestinal: Negative for abdominal pain, constipation, diarrhea, heartburn, nausea and vomiting.  Genitourinary: Negative for dysuria, frequency, hematuria and urgency.  Musculoskeletal: Negative for back pain, joint pain, myalgias and neck pain.  Skin: Negative for itching and rash.  Neurological: Negative for dizziness, tremors and weakness.  Endo/Heme/Allergies: Does not bruise/bleed easily.  Psychiatric/Behavioral: Negative for depression. The patient is not nervous/anxious and does not have insomnia.     Objective: BP 120/80   Ht 5\' 8"  (1.727 m)   Wt 218 lb (98.9 kg)   BMI 33.15 kg/m   Filed Weights   07/20/17 1340  Weight: 218 lb (98.9 kg)   Body mass index is 33.15 kg/m. Physical Exam  Constitutional: She is oriented to person, place, and time. She appears well-developed and well-nourished. No distress.  Genitourinary: Rectum normal, vagina normal and uterus normal. Pelvic exam was performed with patient supine. There  is no rash or lesion on the right labia. There is no rash or lesion on the left labia. Vagina exhibits no lesion. No bleeding in the vagina. Right adnexum does not display mass and does not display tenderness. Left adnexum does not display mass and does not display tenderness. Cervix does not exhibit motion tenderness, lesion, friability or polyp.   Uterus is mobile and midaxial. Uterus is not enlarged or exhibiting a mass.  HENT:  Head: Normocephalic and atraumatic. Head is without laceration.    Right Ear: Hearing normal.  Left Ear: Hearing normal.  Nose: No epistaxis.  No foreign bodies.  Mouth/Throat: Uvula is midline, oropharynx is clear and moist and mucous membranes are normal.  Eyes: Pupils are equal, round, and reactive to light.  Neck: Normal range of motion. Neck supple. No thyromegaly present.  Cardiovascular: Normal rate and regular rhythm. Exam reveals no gallop and no friction rub.  No murmur heard. Pulmonary/Chest: Effort normal and breath sounds normal. No respiratory distress. She has no wheezes. Right breast exhibits no mass, no skin change and no tenderness. Left breast exhibits no mass, no skin change and no tenderness.  Abdominal: Soft. Bowel sounds are normal. She exhibits no distension. There is no tenderness. There is no rebound.  Musculoskeletal: Normal range of motion.  Neurological: She is alert and oriented to person, place, and time. No cranial nerve deficit.  Skin: Skin is warm and dry.  Psychiatric: She has a normal mood and affect. Judgment normal.  Vitals reviewed.  Assessment:  ANNUAL EXAM 1. Annual physical exam   2. Encounter for Nexplanon removal   3. Obesity (BMI 30-39.9)   4. Screening for cervical cancer   5. Vaginal odor    Screening Plan:            1.  Cervical Screening-  Pap smear done today  2. Breast screening- Exam annually and mammogram>40 planned   3. Colonoscopy every 10 years, Hemoccult testing - after age 44  4. Labs managed by PCP  5. Counseling for contraception: abstinence  Other:  1. Annual physical exam  2. Encounter for Nexplanon removal See below.  No BC needed or desired.  3. Obesity (BMI 30-39.9) Weight loss discussed.  Meds also discussed, Rx for Phentermine.  4. Screening for cervical cancer - IGP, rfx Aptima HPV ASCU  5. Metrogel for BV as prob cause of odor.  PAP f/u as well.  Nexplanon removal Procedure note - The Nexplanon was noted in the patient's arm and the end was identified. The skin  was cleansed with a Betadine solution. A small injection of subcutaneous lidocaine with epinephrine was given over the end of the implant. An incision was made at the end of the implant. The rod was noted in the incision and grasped with a hemostat. It was noted to be intact.  Steri-Strip was placed approximating the incision. Hemostasis was noted.    F/U  Return in about 4 weeks (around 08/17/2017) for Follow up.  Annamarie Major, MD, [facility], [facility] 07/20/2017  2:02 PM

## 2017-07-20 NOTE — Patient Instructions (Signed)

## 2017-08-01 ENCOUNTER — Encounter: Payer: Self-pay | Admitting: Obstetrics & Gynecology

## 2017-08-08 ENCOUNTER — Encounter: Payer: Self-pay | Admitting: Obstetrics & Gynecology

## 2017-08-10 ENCOUNTER — Telehealth: Payer: Self-pay

## 2017-08-10 NOTE — Telephone Encounter (Signed)
No antibiotic needed right? Will we just repeat a pap in a year? Please advise

## 2017-08-10 NOTE — Telephone Encounter (Signed)
No meds required Repeat PAP one year

## 2017-08-10 NOTE — Telephone Encounter (Signed)
Pt states she saw her pap smear results in MyChart and it says ASCUS negative HPV. Does she need an antibiotic for this? [facility] as what to do? C/B number [phone]

## 2017-08-11 NOTE — Telephone Encounter (Signed)
Called pt no answer °

## 2017-08-25 ENCOUNTER — Emergency Department: Payer: Self-pay

## 2017-08-25 ENCOUNTER — Emergency Department
Admission: EM | Admit: 2017-08-25 | Discharge: 2017-08-25 | Disposition: A | Discharge status: Home / Self Care | Payer: Self-pay | Attending: Emergency Medicine | Admitting: Emergency Medicine

## 2017-08-25 ENCOUNTER — Encounter: Payer: Self-pay | Admitting: Emergency Medicine

## 2017-08-25 DIAGNOSIS — J00 Acute nasopharyngitis [common cold]: Secondary | ICD-10-CM | POA: Insufficient documentation

## 2017-08-25 DIAGNOSIS — J0100 Acute maxillary sinusitis, unspecified: Secondary | ICD-10-CM | POA: Insufficient documentation

## 2017-08-25 DIAGNOSIS — Z79899 Other long term (current) drug therapy: Secondary | ICD-10-CM | POA: Insufficient documentation

## 2017-08-25 MED ORDER — BENZONATATE 100 MG PO CAPS: ORAL_CAPSULE | ORAL | 0 refills | Status: DC

## 2017-08-25 MED ORDER — PREDNISONE 10 MG (21) PO TBPK: ORAL_TABLET | ORAL | 0 refills | Status: DC

## 2017-08-25 MED ORDER — LORATADINE-PSEUDOEPHEDRINE ER 5-120 MG PO TB12: 1.0000 | ORAL_TABLET | Freq: Two times a day (BID) | ORAL | 0 refills | Status: AC

## 2017-08-25 MED ORDER — PSEUDOEPHEDRINE HCL ER 120 MG PO TB12
120.0000 mg | ORAL_TABLET | Freq: Once | ORAL | Status: AC
  Administered 2017-08-25: 120 mg via ORAL
  Filled 2017-08-25: qty 1

## 2017-08-25 MED ORDER — FLUTICASONE PROPIONATE 50 MCG/ACT NA SUSP: 2.0000 | Freq: Every day | NASAL | 0 refills | Status: DC

## 2017-08-25 NOTE — ED Triage Notes (Signed)
Pt arrived via pov with complaints of body aches, cough, congestion, and fever since Sunday. Pt in NAD in triage.

## 2017-08-25 NOTE — ED Notes (Signed)
See triage note: Pt states she has had symptoms since Sunday. Reports pressure in her head and feels like her "head will explode." Pt is sensitive to light.   Pt was ambulatory to ED-43.

## 2017-08-25 NOTE — ED Notes (Signed)
Patient transported to XR. 

## 2017-08-25 NOTE — Discharge Instructions (Addendum)
Your exam is consistent with allergic rhinitis and sinus congestion. Take the prescription meds as directed. Consider taking OTC Tylenol for headache and fever relief. Avoid Aleve, Ibuprofen, Motrin, and naproxen while taking the steroids.

## 2017-08-25 NOTE — ED Provider Notes (Signed)
[facility] Emergency Department Provider Note ____________________________________________  Time seen: 1341  I have reviewed the triage vital signs and the nursing notes.  HISTORY  Chief Complaint  Cough and Fever  HPI Meredith Riggs is a 32 y.o. female presents to the ED for evaluation of sinus pressure, headache, nasal drainage, and subjective fevers for the last 4 days. She has taken intermittent doses of NyQuil/DayQuil, Benadryl, and Claritin. She notes decreased appetite but is without nausea, vomiting, or cough.   Past Medical History:  Diagnosis Date  . Herpes genitalis   . Vaginal Pap smear, abnormal    Pt. don't know when the last one was abn.     Patient Active Problem List   Diagnosis Date Noted  . Obesity (BMI 30-39.9) 07/20/2017  . Hidradenitis axillaris   . Vaginal pain 04/11/2015  . Nexplanon removal 11/29/2013    Past Surgical History:  Procedure Laterality Date  . CESAREAN SECTION    . HYDRADENITIS EXCISION Left 10/15/2015   Procedure: EXCISION HIDRADENITIS AXILLA;  Surgeon: Lattie Haw, MD;  Location: [facility] ORS;  Service: General;  Laterality: Left;  . INDUCED ABORTION      Prior to Admission medications   Medication Sig Start Date End Date Taking? Authorizing Provider  benzonatate (TESSALON PERLES) 100 MG capsule Take 1-2 tabs TID prn cough 08/25/17   Minola Guin, Charlesetta Ivory, PA-C  fluticasone (FLONASE) 50 MCG/ACT nasal spray Place 2 sprays into both nostrils daily. 08/25/17   Dovid Bartko, Charlesetta Ivory, PA-C  loratadine-pseudoephedrine (CLARITIN-D 12 HOUR) 5-120 MG tablet Take 1 tablet by mouth 2 (two) times daily for 15 days. 08/25/17 09/09/17  Parth Mccormac, Charlesetta Ivory, PA-C  naproxen (NAPROSYN) 500 MG tablet Take 1 tablet (500 mg total) by mouth 2 (two) times daily with a meal. Patient not taking: Reported on 07/20/2017 01/15/17   Joni Reining, PA-C  phentermine (ADIPEX-P) 37.5 MG tablet Take 1 tablet (37.5 mg total) by mouth  daily before breakfast. 07/20/17   Nadara Mustard, MD  predniSONE (STERAPRED UNI-PAK 21 TAB) 10 MG (21) TBPK tablet 6-day taper as directed. 08/25/17   Conchita Truxillo, Charlesetta Ivory, PA-C  sulfamethoxazole-trimethoprim (BACTRIM DS,SEPTRA DS) 800-160 MG tablet Take 1 tablet by mouth 2 (two) times daily. Patient not taking: Reported on 07/20/2017 01/15/17   Joni Reining, PA-C  traMADol (ULTRAM) 50 MG tablet Take 1 tablet (50 mg total) by mouth every 6 (six) hours as needed. 01/15/17 01/15/18  Joni Reining, PA-C    Allergies Patient has no known allergies.  Family History  Problem Relation Age of Onset  . Cancer Maternal Uncle        oral  . Diabetes Neg Hx   . Heart disease Neg Hx   . Hypertension Neg Hx     Social History Social History   Tobacco Use  . Smoking status: Never Smoker  . Smokeless tobacco: Never Used  Substance Use Topics  . Alcohol use: No  . Drug use: No    Review of Systems  Constitutional: Positive for subjective fever. Eyes: Negative for visual changes. ENT: Negative for sore throat. Sinus congestion and facial pressure Cardiovascular: Negative for chest pain. Respiratory: Negative for shortness of breath. Musculoskeletal: Negative for back pain. Skin: Negative for rash. Neurological: Negative for headaches, focal weakness or numbness. ____________________________________________  PHYSICAL EXAM:  VITAL SIGNS: ED Triage Vitals [08/25/17 1302]  Enc Vitals Group     BP 125/86     Pulse Rate 87  Resp 16     Temp 98.3 F (36.8 C)     Temp Source Oral     SpO2 100 %     Weight 210 lb (95.3 kg)     Height 5\' 8"  (1.727 m)     Head Circumference      Peak Flow      Pain Score 10     Pain Loc      Pain Edu?      Excl. in GC?     Constitutional: Alert and oriented. Well appearing and in no distress. Head: Normocephalic and atraumatic. Eyes: Conjunctivae are normal. PERRL. Normal extraocular movements Ears: Canals clear. TMs intact  bilaterally. Nose: Sinus congestion & clear rhinorrhea noted. Nasal turbinates are enlarged, pink, and moist. No epistaxis.  Positive facial sinus pressure noted. Mouth/Throat: Mucous membranes are moist.  Uvula is midline and tonsils are flat.  No oropharyngeal lesions are appreciated. Neck: Supple. No thyromegaly. Hematological/Lymphatic/Immunological: No cervical lymphadenopathy. Cardiovascular: Normal rate, regular rhythm. Normal distal pulses. Respiratory: Normal respiratory effort. No wheezes/rales/rhonchi. Musculoskeletal: Nontender with normal range of motion in all extremities.  Neurologic:  Normal gait without ataxia. Normal speech and language. No gross focal neurologic deficits are appreciated. Skin:  Skin is warm, dry and intact. No rash noted. ____________________________________________  PROCEDURES  Procedures pseudoephedrine 120 mg PO ____________________________________________  INITIAL IMPRESSION / ASSESSMENT AND PLAN / ED COURSE  Patient with ED evaluation of probable rhinitis and sinusitis with a allergic component.  Her exam is overall benign for any indication of influenza or pneumonia.  She will be discharged with a prescription for Flonase, Tessalon Perles, Claritin-D, and a prednisone taper pack.  [facility]. ____________________________________________  FINAL CLINICAL IMPRESSION(S) / ED DIAGNOSES  Final diagnoses:  Acute rhinitis  Acute maxillary sinusitis, recurrence not specified     Lissa HoardMenshew, Jakson Delpilar V Bacon, PA-C 08/25/17 1426    Don PerkingVeronese, [place], MD 08/26/17 1317

## 2017-10-11 ENCOUNTER — Emergency Department: Payer: Medicaid Other

## 2017-10-11 ENCOUNTER — Emergency Department
Admission: EM | Admit: 2017-10-11 | Discharge: 2017-10-11 | Disposition: A | Discharge status: Home / Self Care | Payer: Medicaid Other | Attending: Emergency Medicine | Admitting: Emergency Medicine

## 2017-10-11 ENCOUNTER — Encounter: Payer: Self-pay | Admitting: Emergency Medicine

## 2017-10-11 DIAGNOSIS — Y9389 Activity, other specified: Secondary | ICD-10-CM | POA: Insufficient documentation

## 2017-10-11 DIAGNOSIS — S93491A Sprain of other ligament of right ankle, initial encounter: Secondary | ICD-10-CM

## 2017-10-11 DIAGNOSIS — S93492A Sprain of other ligament of left ankle, initial encounter: Secondary | ICD-10-CM | POA: Insufficient documentation

## 2017-10-11 DIAGNOSIS — Y92219 Unspecified school as the place of occurrence of the external cause: Secondary | ICD-10-CM | POA: Insufficient documentation

## 2017-10-11 DIAGNOSIS — W010XXA Fall on same level from slipping, tripping and stumbling without subsequent striking against object, initial encounter: Secondary | ICD-10-CM | POA: Insufficient documentation

## 2017-10-11 DIAGNOSIS — Y998 Other external cause status: Secondary | ICD-10-CM | POA: Insufficient documentation

## 2017-10-11 DIAGNOSIS — Z79899 Other long term (current) drug therapy: Secondary | ICD-10-CM | POA: Insufficient documentation

## 2017-10-11 MED ORDER — MELOXICAM 15 MG PO TABS: 15.0000 mg | ORAL_TABLET | Freq: Every day | ORAL | 0 refills | Status: DC

## 2017-10-11 NOTE — ED Provider Notes (Signed)
[facility] Emergency Department Provider Note  ____________________________________________  Time seen: Approximately 7:14 PM  I have reviewed the triage vital signs and the nursing notes.   HISTORY  Chief Complaint Ankle Pain    HPI Meredith Riggs is a 32 y.o. female who presents emergency department complaining of right ankle pain.  Patient reports that she was at school, attempted to exit her vehicle and rolled her ankle.  Patient reports pain and swelling to the lateral aspect of the ankle.  Patient is able to bear weight on same.  No other injury or complaint at this time.  Patient did take Motrin prior to arrival which eased pain somewhat and reduce swelling somewhat.  No complaints at this time.  Past Medical History:  Diagnosis Date  . Herpes genitalis   . Vaginal Pap smear, abnormal    Pt. don't know when the last one was abn.     Patient Active Problem List   Diagnosis Date Noted  . Obesity (BMI 30-39.9) 07/20/2017  . Hidradenitis axillaris   . Vaginal pain 04/11/2015  . Nexplanon removal 11/29/2013    Past Surgical History:  Procedure Laterality Date  . CESAREAN SECTION    . HYDRADENITIS EXCISION Left 10/15/2015   Procedure: EXCISION HIDRADENITIS AXILLA;  Surgeon: Lattie Haw, MD;  Location: [facility] ORS;  Service: General;  Laterality: Left;  . INDUCED ABORTION      Prior to Admission medications   Medication Sig Start Date End Date Taking? Authorizing Provider  benzonatate (TESSALON PERLES) 100 MG capsule Take 1-2 tabs TID prn cough 08/25/17   Menshew, Charlesetta Ivory, PA-C  fluticasone (FLONASE) 50 MCG/ACT nasal spray Place 2 sprays into both nostrils daily. 08/25/17   Menshew, Charlesetta Ivory, PA-C  meloxicam (MOBIC) 15 MG tablet Take 1 tablet (15 mg total) by mouth daily. 10/11/17   Jewelianna Pancoast, Delorise Royals, PA-C  naproxen (NAPROSYN) 500 MG tablet Take 1 tablet (500 mg total) by mouth 2 (two) times daily with a meal. Patient not taking:  Reported on 07/20/2017 01/15/17   Joni Reining, PA-C  phentermine (ADIPEX-P) 37.5 MG tablet Take 1 tablet (37.5 mg total) by mouth daily before breakfast. 07/20/17   Nadara Mustard, MD  predniSONE (STERAPRED UNI-PAK 21 TAB) 10 MG (21) TBPK tablet 6-day taper as directed. 08/25/17   Menshew, Charlesetta Ivory, PA-C  sulfamethoxazole-trimethoprim (BACTRIM DS,SEPTRA DS) 800-160 MG tablet Take 1 tablet by mouth 2 (two) times daily. Patient not taking: Reported on 07/20/2017 01/15/17   Joni Reining, PA-C  traMADol (ULTRAM) 50 MG tablet Take 1 tablet (50 mg total) by mouth every 6 (six) hours as needed. 01/15/17 01/15/18  Joni Reining, PA-C    Allergies Patient has no known allergies.  Family History  Problem Relation Age of Onset  . Cancer Maternal Uncle        oral  . Diabetes Neg Hx   . Heart disease Neg Hx   . Hypertension Neg Hx     Social History Social History   Tobacco Use  . Smoking status: Never Smoker  . Smokeless tobacco: Never Used  Substance Use Topics  . Alcohol use: No  . Drug use: No     Review of Systems  Constitutional: No fever/chills Eyes: No visual changes.  Cardiovascular: no chest pain. Respiratory: no cough. No SOB. Gastrointestinal: No abdominal pain.  No nausea, no vomiting.   Musculoskeletal: Positive for right ankle pain and injury Skin: Negative for rash, abrasions, lacerations,  ecchymosis. Neurological: Negative for headaches, focal weakness or numbness. 10-point ROS otherwise negative.  ____________________________________________   PHYSICAL EXAM:  VITAL SIGNS: ED Triage Vitals  Enc Vitals Group     BP 10/11/17 1734 116/72     Pulse Rate 10/11/17 1734 71     Resp 10/11/17 1734 20     Temp 10/11/17 1734 98.4 F (36.9 C)     Temp Source 10/11/17 1734 Oral     SpO2 10/11/17 1734 99 %     Weight 10/11/17 1733 205 lb (93 kg)     Height 10/11/17 1733  (1.702 m)     Head Circumference --      Peak Flow --      Pain Score 10/11/17 1732 9      Pain Loc --      Pain Edu? --      Excl. in GC? --      Constitutional: Alert and oriented. Well appearing and in no acute distress. Eyes: Conjunctivae are normal. PERRL. EOMI. Head: Atraumatic. Neck: No stridor.    Cardiovascular: Normal rate, regular rhythm. Normal S1 and S2.  Good peripheral circulation. Respiratory: Normal respiratory effort without tachypnea or retractions. Lungs CTAB. Good air entry to the bases with no decreased or absent breath sounds. Musculoskeletal: Full range of motion to all extremities. No gross deformities appreciated.  Mild edema appreciated to the lateral right ankle.  No deformity noted.  Patient has full range of motion to the right ankle.  Tender palpation over the lateral malleolus and the anterior talofibular ligament distribution.  No palpable abnormality or deficit.  Dorsalis pedis pulse intact.  Sensation intact all 5 digits. Neurologic:  Normal speech and language. No gross focal neurologic deficits are appreciated.  Skin:  Skin is warm, dry and intact. No rash noted. Psychiatric: Mood and affect are normal. Speech and behavior are normal. Patient exhibits appropriate insight and judgement.   ____________________________________________   LABS (all labs ordered are listed, but only abnormal results are displayed)  Labs Reviewed - No data to display ____________________________________________  EKG   ____________________________________________  RADIOLOGY Festus Barren Annalucia Laino, personally viewed and evaluated these images (plain radiographs) as part of my medical decision making, as well as reviewing the written report by the radiologist.  I concur with radiologist finding of no acute osseous abnormality to the right ankle.  Dg Ankle Complete Right  Result Date: 10/11/2017 CLINICAL DATA:  Pain after twisting injury. EXAM: RIGHT ANKLE - COMPLETE 3+ VIEW COMPARISON:  None. FINDINGS: Lateral soft tissue swelling.  No fractures.  IMPRESSION: Soft tissue swelling laterally.  No fractures. Electronically Signed   By: Gerome Sam III M.D   On: 10/11/2017 18:53    ____________________________________________    PROCEDURES  Procedure(s) performed:    Procedures    Medications - No data to display   ____________________________________________   INITIAL IMPRESSION / ASSESSMENT AND PLAN / ED COURSE  Pertinent labs & imaging results that were available during my care of the patient were reviewed by me and considered in my medical decision making (see chart for details).  Review of the [place] CSRS was performed in accordance of the [facility] prior to dispensing any controlled drugs.     Patient's diagnosis is consistent with right ankle sprain.  Patient presents the emergency department after rolling her ankle while attempting to exit her vehicle.  Exam is reassuring.  X-ray reveals no acute osseous abnormality.. Patient will be discharged home with prescriptions for meloxicam for  symptom improvement. Patient is to follow up with primary care as needed or otherwise directed. Patient is given ED precautions to return to the ED for any worsening or new symptoms.     ____________________________________________  FINAL CLINICAL IMPRESSION(S) / ED DIAGNOSES  Final diagnoses:  Sprain of anterior talofibular ligament of right ankle, initial encounter      NEW MEDICATIONS STARTED DURING THIS VISIT:  ED Discharge Orders        Ordered    meloxicam (MOBIC) 15 MG tablet  Daily     10/11/17 1915          This chart was dictated using voice recognition software/Dragon. Despite best efforts to proofread, errors can occur which can change the meaning. Any change was purely unintentional.    Racheal Patches, PA-C 10/11/17 1929    Rockne Menghini, MD 10/12/17 [phone]

## 2017-10-11 NOTE — ED Triage Notes (Signed)
Pt reports rolled her ankle walking today. Pt reports painful to walk on.

## 2018-05-17 NOTE — L&D Delivery Note (Signed)
VBAC Delivery Note Primary OB: [facility] Delivery Physician: Barnett Applebaum, MD Gestational Age: Full term Antepartum complications: Prior Cesarean Section Intrapartum complications: None  A viable Female was delivered via vertex presentation.  Apgars:9 ,9  Weight:  pending .   Placenta status: spontaneous and Intact.  Cord: 3+ vessels;  with the following complications: nuchal.  Anesthesia:  none Episiotomy:  none Lacerations:  none Suture Repair: none Est. Blood Loss (mL):  200 mL  Mom to postpartum.  Baby to Couplet care / Skin to Skin.  Barnett Applebaum, MD, [facility], [facility] 03/06/2019  8:31 PM [phone]

## 2018-09-07 ENCOUNTER — Other Ambulatory Visit: Payer: Self-pay

## 2018-09-07 ENCOUNTER — Other Ambulatory Visit (HOSPITAL_COMMUNITY)
Admission: RE | Admit: 2018-09-07 | Discharge: 2018-09-07 | Disposition: A | Discharge status: Home / Self Care | Payer: Medicaid Other | Source: Ambulatory Visit | Attending: Advanced Practice Midwife | Admitting: Advanced Practice Midwife

## 2018-09-07 ENCOUNTER — Encounter: Payer: Self-pay | Admitting: Advanced Practice Midwife

## 2018-09-07 ENCOUNTER — Ambulatory Visit (INDEPENDENT_AMBULATORY_CARE_PROVIDER_SITE_OTHER): Payer: Self-pay | Admitting: Advanced Practice Midwife

## 2018-09-07 VITALS — BP 108/66 | HR 82 | Wt 213.0 lb

## 2018-09-07 DIAGNOSIS — O99212 Obesity complicating pregnancy, second trimester: Secondary | ICD-10-CM

## 2018-09-07 DIAGNOSIS — Z3480 Encounter for supervision of other normal pregnancy, unspecified trimester: Secondary | ICD-10-CM

## 2018-09-07 DIAGNOSIS — Z113 Encounter for screening for infections with a predominantly sexual mode of transmission: Secondary | ICD-10-CM | POA: Diagnosis present

## 2018-09-07 DIAGNOSIS — Z124 Encounter for screening for malignant neoplasm of cervix: Secondary | ICD-10-CM | POA: Diagnosis present

## 2018-09-07 DIAGNOSIS — O99210 Obesity complicating pregnancy, unspecified trimester: Secondary | ICD-10-CM

## 2018-09-07 DIAGNOSIS — Z3A14 14 weeks gestation of pregnancy: Secondary | ICD-10-CM

## 2018-09-07 DIAGNOSIS — Z1379 Encounter for other screening for genetic and chromosomal anomalies: Secondary | ICD-10-CM

## 2018-09-07 DIAGNOSIS — Z131 Encounter for screening for diabetes mellitus: Secondary | ICD-10-CM

## 2018-09-07 LAB — POCT URINALYSIS DIPSTICK OB
Appearance: NORMAL
Bilirubin, UA: NEGATIVE
Blood, UA: NEGATIVE
Glucose, UA: NEGATIVE
Ketones, UA: NEGATIVE
Leukocytes, UA: NEGATIVE
Nitrite, UA: NEGATIVE
Odor: NEGATIVE
POC,PROTEIN,UA: NEGATIVE
Spec Grav, UA: 1.02 (ref 1.010–1.025)
Urobilinogen, UA: 0.2 E.U./dL
pH, UA: 6 (ref 5.0–8.0)

## 2018-09-07 NOTE — Progress Notes (Signed)
NOB Confirmed at [facility] 

## 2018-09-07 NOTE — Patient Instructions (Signed)
Exercise During Pregnancy For people of all ages, exercise is an important part of being healthy. Exercise improves heart and lung function and helps to maintain strength, flexibility, and a healthy body weight. Exercise also boosts energy levels and elevates mood. For most women, maintaining an exercise routine throughout pregnancy is recommended. It is only on rare occasions and with certain medical conditions or pregnancy complications that women may be asked to limit or avoid exercise during pregnancy. What are some other benefits to exercising during pregnancy? Along with maintaining strength and flexibility, exercising throughout pregnancy can help to:  Keep strength in muscles that are very important during labor and childbirth.  Decrease low back pain during pregnancy.  Decrease the risk of developing gestational diabetes mellitus (GDM).  Improve blood sugar (glucose) control for women who have GDM.  Decrease the risk of developing preeclampsia. This is a serious condition that causes high blood pressure along with other symptoms, such as swelling and headaches.  Decrease the risk of cesarean delivery.  Speed up the recovery after giving birth. How often should I exercise? Unless your health care provider gives you different instructions, you should try to exercise on most days or all days of the week. In general, try to exercise with moderate intensity for about 150 minutes per week. This can be spread out across several days, such as exercising for 30 minutes per day on 5 days of each week. You can tell that you are exercising at a moderate intensity if you have a higher heart rate and faster breathing, but you are still able to hold a conversation. What types of moderate-intensity exercise are recommended during pregnancy? There are many types of exercise that are safe for you to do during pregnancy. Unless your health care provider gives you different instructions, do a variety of  exercises that safely increase your heart and breathing (cardiopulmonary) rates and help you to build and maintain muscle strength (strength training). You should always be able to talk in full sentences while exercising during pregnancy. Some examples of exercising that is safe to do during pregnancy include:  Brisk walking or hiking.  Swimming.  Water aerobics.  Riding a stationary bike.  Strength training.  Modified yoga or Pilates. Tell your instructor that you are pregnant. Avoid overstretching and avoid lying on your back for long periods of time.  Running or jogging. Only choose this type of exercise if: ? You ran or jogged regularly before your pregnancy. ? You can run or jog and still talk in complete sentences. What types of exercise should I not do during pregnancy? Depending on your level of fitness and whether you exercised regularly before your pregnancy, you may be advised to limit vigorous-intensity exercise during your pregnancy. You can tell that you are exercising at a vigorous intensity if you are breathing much harder and faster and cannot hold a conversation while exercising. Some examples of exercising that you should avoid during pregnancy include:  Contact sports.  Activities that place you at risk for falling on or being hit in the belly, such as downhill skiing, water skiing, surfing, rock climbing, cycling, gymnastics, and horseback riding.  Scuba diving.  Sky diving.  Yoga or Pilates in a room that is heated to extreme temperatures ("hot yoga" or "hot Pilates").  Jogging or running, unless you ran or jogged regularly before your pregnancy. While jogging or running, you should always be able to talk in full sentences. Do not run or jog so vigorously that you   are unable to have a conversation.  If you are not used to exercising at elevation (more than 6,000 feet above sea level), do not do so during your pregnancy. When should I avoid exercising during  pregnancy? Certain medical conditions can make it unsafe to exercise during pregnancy, or they may increase your risk of miscarriage or early labor and birth. Some of these conditions include:  Some types of heart disease.  Some types of lung disease.  Placenta previa. This is when the placenta partially or completely covers the opening of the uterus (cervix).  Frequent bleeding from the vagina during your pregnancy.  Incompetent cervix. This is when your cervix does not remain as tightly closed during pregnancy as it should.  Premature labor.  Ruptured membranes. This is when the protective sac (amniotic sac) opens up and amniotic fluid leaks from your vagina.  Severely low blood count (anemia).  Preeclampsia or pregnancy-caused high blood pressure.  Carrying more than one baby (multiple gestation) and having an additional risk of early labor.  Poorly controlled diabetes.  Being severely underweight or severely overweight.  Intrauterine growth restriction. This is when your baby's growth and development during pregnancy are slower than expected.  Other medical conditions. Ask your health care provider if any apply to you. What else should I know about exercising during pregnancy? You should take these precautions while exercising during pregnancy:  Avoid overheating. ? Wear loose-fitting, breathable clothes. ? Do not exercise in very high temperatures.  Avoid dehydration. Drink enough water before, during, and after exercise to keep your urine clear or pale yellow.  Avoid overstretching. Because of hormone changes during pregnancy, it is easy to overstretch muscles, tendons, and ligaments during pregnancy.  Start slowly and ask your health care provider to recommend types of exercise that are safe for you, if exercising regularly is new for you. Pregnancy is not a time for exercising to lose weight. When should I seek medical care? You should stop exercising and call your  health care provider if you have any unusual symptoms, such as:  Mild uterine contractions or abdominal cramping.  Dizziness that does not improve with rest. When should I seek immediate medical care? You should stop exercising and call your local emergency services (911 in the [place] if you have any unusual symptoms, such as:  Sudden, severe pain in your low back or your belly.  Uterine contractions or abdominal cramping that do not improve with rest.  Chest pain.  Bleeding or fluid leaking from your vagina.  Shortness of breath. This information is not intended to replace advice given to you by your health care provider. Make sure you discuss any questions you have with your health care provider. Document Released: 05/03/2005 Document Revised: 10/01/2015 Document Reviewed: 07/11/2014 Elsevier Interactive Patient Education  2019 [employer] Eating Plan for Pregnant Women While you are pregnant, your body requires additional nutrition to help support your growing baby. You also have a higher need for some vitamins and minerals, such as folic acid, calcium, iron, and vitamin D. Eating a healthy, well-balanced diet is very important for your health and your baby's health. Your need for extra calories varies for the three 3-month segments of your pregnancy (trimesters). For most women, it is recommended to consume:  150 extra calories a day during the first trimester.  300 extra calories a day during the second trimester.  300 extra calories a day during the third trimester. What are tips for following this plan?   Do   not try to lose weight or go on a diet during pregnancy.  Limit your overall intake of foods that have "empty calories." These are foods that have little nutritional value, such as sweets, desserts, candies, and sugar-sweetened beverages.  Eat a variety of foods (especially fruits and vegetables) to get a full range of vitamins and minerals.  Take a prenatal vitamin  to help meet your additional vitamin and mineral needs during pregnancy, specifically for folic acid, iron, calcium, and vitamin D.  Remember to stay active. Ask your health care provider what types of exercise and activities are safe for you.  Practice good food safety and cleanliness. Wash your hands before you eat and after you prepare raw meat. Wash all fruits and vegetables well before peeling or eating. Taking these actions can help to prevent food-borne illnesses that can be very dangerous to your baby, such as listeriosis. Ask your health care provider for more information about listeriosis. What does 150 extra calories look like? Healthy options that provide 150 extra calories each day could be any of the following:  6-8 oz (170-230 g) of plain low-fat yogurt with  cup of berries.  1 apple with 2 teaspoons (11 g) of peanut butter.  Cut-up vegetables with  cup (60 g) of hummus.  8 oz (230 mL) or 1 cup of low-fat chocolate milk.  1 stick of string cheese with 1 medium orange.  1 peanut butter and jelly sandwich that is made with one slice of whole-wheat bread and 1 tsp (5 g) of peanut butter. For 300 extra calories, you could eat two of those healthy options each day. What is a healthy amount of weight to gain? The right amount of weight gain for you is based on your BMI before you became pregnant. If your BMI:  Was less than 18 (underweight), you should gain 28-40 lb (13-18 kg).  Was 18-24.9 (normal), you should gain 25-35 lb (11-16 kg).  Was 25-29.9 (overweight), you should gain 15-25 lb (7-11 kg).  Was 30 or greater (obese), you should gain 11-20 lb (5-9 kg). What if I am having twins or multiples? Generally, if you are carrying twins or multiples:  You may need to eat 300-600 extra calories a day.  The recommended range for total weight gain is 25-54 lb (11-25 kg), depending on your BMI before pregnancy.  Talk with your health care provider to find out about  nutritional needs, weight gain, and exercise that is right for you. What foods can I eat?  Grains All grains. Choose whole grains, such as whole-wheat bread, oatmeal, or brown rice. Vegetables All vegetables. Eat a variety of colors and types of vegetables. Remember to wash your vegetables well before peeling or eating. Fruits All fruits. Eat a variety of colors and types of fruit. Remember to wash your fruits well before peeling or eating. Meats and other protein foods Lean meats, including chicken, turkey, fish, and lean cuts of beef, veal, or pork. If you eat fish or seafood, choose options that are higher in omega-3 fatty acids and lower in mercury, such as salmon, herring, mussels, trout, sardines, pollock, shrimp, crab, and lobster. Tofu. Tempeh. Beans. Eggs. Peanut butter and other nut butters. Make sure that all meats, poultry, and eggs are cooked to food-safe temperatures or "well-done." Two or more servings of fish are recommended each week in order to get the most benefits from omega-3 fatty acids that are found in seafood. Choose fish that are lower in mercury. You can   find more information online:  www.fda.gov Dairy Pasteurized milk and milk alternatives (such as almond milk). Pasteurized yogurt and pasteurized cheese. Cottage cheese. Sour cream. Beverages Water. Juices that contain 100% fruit juice or vegetable juice. Caffeine-free teas and decaffeinated coffee. Drinks that contain caffeine are okay to drink, but it is better to avoid caffeine. Keep your total caffeine intake to less than 200 mg each day (which is 12 oz or 355 mL of coffee, tea, or soda) or the limit as told by your health care provider. Fats and oils Fats and oils are okay to include in moderation. Sweets and desserts Sweets and desserts are okay to include in moderation. Seasoning and other foods All pasteurized condiments. The items listed above may not be a complete list of recommended foods and beverages.  Contact your dietitian for more options. What foods are not recommended? Vegetables Raw (unpasteurized) vegetable juices. Fruits Unpasteurized fruit juices. Meats and other protein foods Lunch meats, bologna, hot dogs, or other deli meats. (If you must eat those meats, reheat them until they are steaming hot.) Refrigerated pat, meat spreads from a meat counter, smoked seafood that is found in the refrigerated section of a store. Raw or undercooked meats, poultry, and eggs. Raw fish, such as sushi or sashimi. Fish that have high mercury content, such as tilefish, shark, swordfish, and king mackerel. To learn more about mercury in fish, talk with your health care provider or look for online resources, such as:  www.fda.gov Dairy Raw (unpasteurized) milk and any foods that have raw milk in them. Soft cheeses, such as feta, queso blanco, queso fresco, Brie, Camembert cheeses, blue-veined cheeses, and Panela cheese (unless it is made with pasteurized milk, which must be stated on the label). Beverages Alcohol. Sugar-sweetened beverages, such as sodas, teas, or energy drinks. Seasoning and other foods Homemade fermented foods and drinks, such as pickles, sauerkraut, or kombucha drinks. (Store-bought pasteurized versions of these are okay.) Salads that are made in a store or deli, such as ham salad, chicken salad, egg salad, tuna salad, and seafood salad. The items listed above may not be a complete list of foods and beverages to avoid. Contact your dietitian for more information. Where to find more information To calculate the number of calories you need based on your height, weight, and activity level, you can use an online calculator such as:  www.choosemyplate.gov/MyPlatePlan To calculate how much weight you should gain during pregnancy, you can use an online pregnancy weight gain calculator such as:  www.choosemyplate.gov/pregnancy-weight-gain-calculator Summary  While you are pregnant,  your body requires additional nutrition to help support your growing baby.  Eat a variety of foods, especially fruits and vegetables to get a full range of vitamins and minerals.  Practice good food safety and cleanliness. Wash your hands before you eat and after you prepare raw meat. Wash all fruits and vegetables well before peeling or eating. Taking these actions can help to prevent food-borne illnesses, such as listeriosis, that can be very dangerous to your baby.  Do not eat raw meat or fish. Do not eat fish that have high mercury content, such as tilefish, shark, swordfish, and king mackerel. Do not eat unpasteurized (raw) dairy.  Take a prenatal vitamin to help meet your additional vitamin and mineral needs during pregnancy, specifically for folic acid, iron, calcium, and vitamin D. This information is not intended to replace advice given to you by your health care provider. Make sure you discuss any questions you have with your health care   provider. Document Released: 02/15/2014 Document Revised: 01/28/2017 Document Reviewed: 01/28/2017 Elsevier Interactive Patient Education  2019 [employer] Prenatal Care Prenatal care is health care during pregnancy. It helps you and your unborn baby (fetus) stay as healthy as possible. Prenatal care may be provided by a midwife, a family practice health care provider, or a childbirth and pregnancy specialist (obstetrician). How does this affect me? During pregnancy, you will be closely monitored for any new conditions that might develop. To lower your risk of pregnancy complications, you and your health care provider will talk about any underlying conditions you have. How does this affect my baby? Early and consistent prenatal care increases the chance that your baby will be healthy during pregnancy. Prenatal care lowers the risk that your baby will be:  Born early (prematurely).  Smaller than expected at birth (small for gestational age). What  can I expect at the first prenatal care visit? Your first prenatal care visit will likely be the longest. You should schedule your first prenatal care visit as soon as you know that you are pregnant. Your first visit is a good time to talk about any questions or concerns you have about pregnancy. At your visit, you and your health care provider will talk about:  Your medical history, including: ? Any past pregnancies. ? Your family's medical history. ? The baby's father's medical history. ? Any long-term (chronic) health conditions you have and how you manage them. ? Any surgeries or procedures you have had. ? Any current over-the-counter or prescription medicines, herbs, or supplements you are taking.  Other factors that could pose a risk to your baby, including:  Your home setting and your stress levels, including: ? Exposure to abuse or violence. ? Household financial strain. ? Mental health conditions you have.  Your daily health habits, including diet and exercise. Your health care provider will also:  Measure your weight, height, and blood pressure.  Do a physical exam, including a pelvic and breast exam.  Perform blood tests and urine tests to check for: ? Urinary tract infection. ? Sexually transmitted infections (STIs). ? Low iron levels in your blood (anemia). ? Blood type and certain proteins on red blood cells (Rh antibodies). ? Infections and immunity to viruses, such as hepatitis B and rubella. ? HIV (human immunodeficiency virus).  Do an ultrasound to confirm your baby's growth and development and to help predict your estimated due date (EDD). This ultrasound is done with a probe that is inserted into the vagina (transvaginal ultrasound).  Discuss your options for genetic screening.  Give you information about how to keep yourself and your baby healthy, including: ? Nutrition and taking vitamins. ? Physical activity. ? How to manage pregnancy symptoms such as  nausea and vomiting (morning sickness). ? Infections and substances that may be harmful to your baby and how to avoid them. ? Food safety. ? Dental care. ? Working. ? Travel. ? Warning signs to watch for and when to call your health care provider. How often will I have prenatal care visits? After your first prenatal care visit, you will have regular visits throughout your pregnancy. The visit schedule is often as follows:  Up to week 28 of pregnancy: once every 4 weeks.  28-36 weeks: once every 2 weeks.  After 36 weeks: every week until delivery. Some women may have visits more or less often depending on any underlying health conditions and the health of the baby. Keep all follow-up and prenatal care visits as told by   your health care provider. This is important. What happens during routine prenatal care visits? Your health care provider will:  Measure your weight and blood pressure.  Check for fetal heart sounds.  Measure the height of your uterus in your abdomen (fundal height). This may be measured starting around week 20 of pregnancy.  Check the position of your baby inside your uterus.  Ask questions about your diet, sleeping patterns, and whether you can feel the baby move.  Review warning signs to watch for and signs of labor.  Ask about any pregnancy symptoms you are having and how you are dealing with them. Symptoms may include: ? Headaches. ? Nausea and vomiting. ? Vaginal discharge. ? Swelling. ? Fatigue. ? Constipation. ? Any discomfort, including back or pelvic pain. Make a list of questions to ask your health care provider at your routine visits. What tests might I have during prenatal care visits? You may have blood, urine, and imaging tests throughout your pregnancy, such as:  Urine tests to check for glucose, protein, or signs of infection.  Glucose tests to check for a form of diabetes that can develop during pregnancy (gestational diabetes mellitus).  This is usually done around week 24 of pregnancy.  An ultrasound to check your baby's growth and development and to check for birth defects. This is usually done around week 20 of pregnancy.  A test to check for group B strep (GBS) infection. This is usually done around week 36 of pregnancy.  Genetic testing. This may include blood or imaging tests, such as an ultrasound. Some genetic tests are done during the first trimester and some are done during the second trimester. What else can I expect during prenatal care visits? Your health care provider may recommend getting certain vaccines during pregnancy. These may include:  A yearly flu shot (annual influenza vaccine). This is especially important if you will be pregnant during flu season.  Tdap (tetanus, diphtheria, pertussis) vaccine. Getting this vaccine during pregnancy can protect your baby from whooping cough (pertussis) after birth. This vaccine may be recommended between weeks 27 and 36 of pregnancy. Later in your pregnancy, your health care provider may give you information about:  Childbirth and breastfeeding classes.  Choosing a health care provider for your baby.  Umbilical cord banking.  Breastfeeding.  Birth control after your baby is born.  The hospital labor and delivery unit and how to tour it.  Registering at the hospital before you go into labor. Where to find more information  Office on Women's Health: womenshealth.gov  American Pregnancy Association: americanpregnancy.org  March of Dimes: marchofdimes.org Summary  Prenatal care helps you and your baby stay as healthy as possible during pregnancy.  Your first prenatal care visit will most likely be the longest.  You will have visits and tests throughout your pregnancy to monitor your health and your baby's health.  Bring a list of questions to your visits to ask your health care provider.  Make sure to keep all follow-up and prenatal care visits with  your health care provider. This information is not intended to replace advice given to you by your health care provider. Make sure you discuss any questions you have with your health care provider. Document Released: 05/06/2003 Document Revised: 05/02/2017 Document Reviewed: 05/02/2017 Elsevier Interactive Patient Education  2019 [employer]  

## 2018-09-08 DIAGNOSIS — O99210 Obesity complicating pregnancy, unspecified trimester: Secondary | ICD-10-CM | POA: Insufficient documentation

## 2018-09-08 LAB — URINE DRUG PANEL 7
Amphetamines, Urine: NEGATIVE ng/mL
Barbiturate Quant, Ur: NEGATIVE ng/mL
Benzodiazepine Quant, Ur: NEGATIVE ng/mL
Cannabinoid Quant, Ur: NEGATIVE ng/mL
Cocaine (Metab.): NEGATIVE ng/mL
Opiate Quant, Ur: NEGATIVE ng/mL
PCP Quant, Ur: NEGATIVE ng/mL

## 2018-09-08 NOTE — Progress Notes (Signed)
New Obstetric Patient H&P    Chief Complaint: "Desires prenatal care"   History of Present Illness: Patient is a 33 y.o. [id number] Not Hispanic or Latino female, presents with amenorrhea and positive home pregnancy test. Patient's last menstrual period was 05/26/2018 (within days). and based on her  LMP, her EDD is Estimated Date of Delivery: 03/02/2019. and her EGA is [redacted]w[redacted]d. Cycles are 5. days, regular, and occur approximately every : 28 days. Her last pap smear was 1 years ago and was ASCUS with NEGATIVE high risk HPV.    She had a urine pregnancy test which was positive 2 or 3 week(s)  ago. Her last menstrual period was normal and lasted for  5 day(s). Since her LMP she claims she has experienced breast tenderness, fatigue, nausea. She denies vaginal bleeding. Her past medical history is noncontributory. Her prior pregnancies are notable for 2 miscarriages, c/s 2009 for FITL, VBAC 2016, 7# 10oz.  Since her LMP, she admits to the use of tobacco products  no She claims she has gained   8 pounds since the start of her pregnancy.  There are cats in the home in the home  no   She admits close contact with children on a regular basis Yes She has had chicken pox in the past yes She has had Tuberculosis exposures, symptoms, or previously tested positive for TB   no Current or past history of domestic violence. no  Genetic Screening/Teratology Counseling: (Includes patient, baby's father, or anyone in either family with:)   1. Patient's age >/= 31 at [facility]  no 2. Thalassemia ([place], [place], Mediterranean, or Asian background): MCV<80  no 3. Neural tube defect (meningomyelocele, spina bifida, anencephaly)  no 4. Congenital heart defect  no  5. Down syndrome  no 6. Tay-Sachs (Jewish, [place]  no 7. Canavan's Disease  no 8. Sickle cell disease or trait (African)  no  9. Hemophilia or other blood disorders  no  10. Muscular dystrophy  no  11. Cystic fibrosis  no  12. Huntington's  Chorea  no  13. Mental retardation/autism  no 14. Other inherited genetic or chromosomal disorder  no 15. Maternal metabolic disorder (DM, PKU, etc)  no 16. Patient or FOB with a child with a birth defect not listed above no  16a. Patient or FOB with a birth defect themselves no 17. Recurrent pregnancy loss, or stillbirth  no  18. Any medications since LMP other than prenatal vitamins (include vitamins, supplements, OTC meds, drugs, alcohol)  no 19. Any other genetic/environmental exposure to discuss  no  Infection History:   1. Lives with someone with TB or TB exposed  no  2. Patient or partner has history of genital herpes  yes 3. Rash or viral illness since LMP  no 4. History of STI (GC, CT, HPV, syphilis, HIV)  no 5. History of recent travel :  no  Other pertinent information:  no     Review of Systems:10 point review of systems negative unless otherwise noted in HPI  Past Medical History:  Past Medical History:  Diagnosis Date  . Herpes genitalis   . Vaginal Pap smear, abnormal    Pt. don't know when the last one was abn.     Past Surgical History:  Past Surgical History:  Procedure Laterality Date  . CESAREAN SECTION    . HYDRADENITIS EXCISION Left 10/15/2015   Procedure: EXCISION HIDRADENITIS AXILLA;  Surgeon: Lattie Haw, MD;  Location: [facility] ORS;  Service: General;  Laterality: Left;  . INDUCED ABORTION      Gynecologic History: Patient's last menstrual period was 05/26/2018 (within days).  Obstetric History: [id number]  Family History:  Family History  Problem Relation Age of Onset  . Cancer Maternal Uncle        oral  . Diabetes Neg Hx   . Heart disease Neg Hx   . Hypertension Neg Hx     Social History:  Social History   Socioeconomic History  . Marital status: Single    Spouse name: Not on file  . Number of children: Not on file  . Years of education: Not on file  . Highest education level: Not on file  Occupational History  . Not on file   Social Needs  . Financial resource strain: Not on file  . Food insecurity:    Worry: Not on file    Inability: Not on file  . Transportation needs:    Medical: Not on file    Non-medical: Not on file  Tobacco Use  . Smoking status: Never Smoker  . Smokeless tobacco: Never Used  Substance and Sexual Activity  . Alcohol use: No  . Drug use: No  . Sexual activity: Yes    Birth control/protection: Implant, None  Lifestyle  . Physical activity:    Days per week: Not on file    Minutes per session: Not on file  . Stress: Not on file  Relationships  . Social connections:    Talks on phone: Not on file    Gets together: Not on file    Attends religious service: Not on file    Active member of club or organization: Not on file    Attends meetings of clubs or organizations: Not on file    Relationship status: Not on file  . Intimate partner violence:    Fear of current or ex partner: Not on file    Emotionally abused: Not on file    Physically abused: Not on file    Forced sexual activity: Not on file  Other Topics Concern  . Not on file  Social History Narrative   ** Merged History Encounter **        Allergies:  No Known Allergies  Medications: Prior to Admission medications   Not on File    Physical Exam Vitals: Blood pressure 108/66, pulse 82, weight 213 lb (96.6 kg), last menstrual period 05/26/2018  General: NAD HEENT: normocephalic, anicteric Thyroid: no enlargement, no palpable nodules Pulmonary: No increased work of breathing, CTAB Cardiovascular: RRR, distal pulses 2+ Abdomen: NABS, soft, non-tender, non-distended.  Umbilicus without lesions.  No hepatomegaly, splenomegaly or masses palpable. No evidence of hernia, FHTs 140s Genitourinary:  External: Normal external female genitalia.  Normal urethral meatus, normal Bartholin's and Skene's glands.    Vagina: Normal vaginal mucosa, no evidence of prolapse.    Cervix: Grossly normal in appearance, no  bleeding, no CMT  Uterus: Enlarged, mobile, normal contour.    Adnexa: ovaries non-enlarged, no adnexal masses  Rectal: deferred Extremities: no edema, erythema, or tenderness Neurologic: Grossly intact Psychiatric: mood appropriate, affect full   Assessment: 32 y.o. [id number] at 14 weeks 4 days by approximate LMP presenting to initiate prenatal care  Plan: 1) Avoid alcoholic beverages. 2) Patient encouraged not to smoke.  3) Discontinue the use of all non-medicinal drugs and chemicals.  4) Take prenatal vitamins daily.  5) Nutrition, food safety (fish, cheese advisories, and high nitrite foods) and exercise discussed. 6) Hospital and practice  style discussed with cross coverage system.  7) Genetic Screening, such as with 1st Trimester Screening, cell free fetal DNA, AFP testing, and Ultrasound, as well as with amniocentesis and CVS as appropriate, is discussed with patient. At the conclusion of today's visit patient requested genetic testing 8) Patient is asked about travel to areas at risk for the Zika virus, and counseled to avoid travel and exposure to mosquitoes or sexual partners who may have themselves been exposed to the virus. Testing is discussed, and will be ordered as appropriate.  9) Return to clinic in 1 week for dating scan, lab work including NOB panel, sickle cell evaluation, early 1 hour gtt (all future ordered), MaterniT21 (not yet ordered), ROB   Meredith MallJane Yamina Riggs, CNM [facility] 09/08/2018, 10:39 AM

## 2018-09-10 LAB — URINE CULTURE: Organism ID, Bacteria: NO GROWTH

## 2018-09-15 LAB — CYTOLOGY - PAP
Chlamydia: NEGATIVE
Diagnosis: UNDETERMINED — AB
HPV: NOT DETECTED
Neisseria Gonorrhea: NEGATIVE
Trichomonas: NEGATIVE

## 2018-09-17 ENCOUNTER — Other Ambulatory Visit: Payer: Self-pay | Admitting: Advanced Practice Midwife

## 2018-09-17 DIAGNOSIS — B373 Candidiasis of vulva and vagina: Secondary | ICD-10-CM

## 2018-09-17 DIAGNOSIS — B3731 Acute candidiasis of vulva and vagina: Secondary | ICD-10-CM

## 2018-09-17 MED ORDER — TERCONAZOLE 0.8 % VA CREA: 1.0000 | TOPICAL_CREAM | Freq: Every day | VAGINAL | 0 refills | Status: DC

## 2018-09-17 NOTE — Progress Notes (Signed)
Rx Terconazole sent to treat vaginal yeast infection.

## 2018-09-25 ENCOUNTER — Encounter: Payer: Self-pay | Admitting: Maternal Newborn

## 2018-09-25 ENCOUNTER — Other Ambulatory Visit: Payer: Self-pay

## 2018-09-25 ENCOUNTER — Other Ambulatory Visit: Payer: Self-pay | Admitting: Advanced Practice Midwife

## 2018-09-25 ENCOUNTER — Ambulatory Visit (INDEPENDENT_AMBULATORY_CARE_PROVIDER_SITE_OTHER): Payer: Medicaid Other

## 2018-09-25 ENCOUNTER — Ambulatory Visit (INDEPENDENT_AMBULATORY_CARE_PROVIDER_SITE_OTHER): Payer: Medicaid Other | Admitting: Maternal Newborn

## 2018-09-25 VITALS — BP 122/64 | Wt 218.0 lb

## 2018-09-25 DIAGNOSIS — O26899 Other specified pregnancy related conditions, unspecified trimester: Secondary | ICD-10-CM

## 2018-09-25 DIAGNOSIS — Z113 Encounter for screening for infections with a predominantly sexual mode of transmission: Secondary | ICD-10-CM

## 2018-09-25 DIAGNOSIS — Z3480 Encounter for supervision of other normal pregnancy, unspecified trimester: Secondary | ICD-10-CM

## 2018-09-25 DIAGNOSIS — R12 Heartburn: Secondary | ICD-10-CM

## 2018-09-25 DIAGNOSIS — Z363 Encounter for antenatal screening for malformations: Secondary | ICD-10-CM | POA: Diagnosis not present

## 2018-09-25 DIAGNOSIS — R110 Nausea: Secondary | ICD-10-CM

## 2018-09-25 DIAGNOSIS — Z3A17 17 weeks gestation of pregnancy: Secondary | ICD-10-CM

## 2018-09-25 DIAGNOSIS — Z1379 Encounter for other screening for genetic and chromosomal anomalies: Secondary | ICD-10-CM

## 2018-09-25 LAB — POCT URINALYSIS DIPSTICK OB: Glucose, UA: NEGATIVE

## 2018-09-25 MED ORDER — FAMOTIDINE 20 MG PO TABS: 20.0000 mg | ORAL_TABLET | Freq: Two times a day (BID) | ORAL | 3 refills | Status: DC

## 2018-09-25 MED ORDER — DOXYLAMINE-PYRIDOXINE 10-10 MG PO TBEC: 2.0000 | DELAYED_RELEASE_TABLET | Freq: Every day | ORAL | 5 refills | Status: DC

## 2018-09-25 NOTE — Progress Notes (Signed)
Routine Prenatal Care Visit  Subjective  Meredith Riggs is a 33 y.o. [phone] at Unknown being seen today for ongoing prenatal care.  She is currently monitored for the following issues for this low-risk pregnancy and has Hidradenitis axillaris; Obesity (BMI 30-39.9); Supervision of other normal pregnancy, antepartum; and Obesity affecting pregnancy, antepartum on their problem list.  ----------------------------------------------------------------------------------- Patient reports heartburn, nausea, dry mouth, and ptyalism.  She is able to keep down food and drink. She is taking Tums multiple times daily to help with symptoms.  Vag. Bleeding: None.  Movement: Present. No leaking of fluid.  ----------------------------------------------------------------------------------- The following portions of the patient's history were reviewed and updated as appropriate: allergies, current medications, past family history, past medical history, past social history, past surgical history and problem list. Problem list updated.  Objective  Blood pressure 122/64, weight 218 lb (98.9 kg), last menstrual period 05/26/2018, unknown if currently breastfeeding. Pregravid weight 205 lb (93 kg) Total Weight Gain 13 lb (5.897 kg) Urinalysis: Urine dipstick shows negative for glucose, positive for protein (trace) Fetal Status: Fetal Heart Rate (bpm): 160   Movement: Present     General:  Alert, oriented and cooperative. Patient is in no acute distress.  Skin: Skin is warm and dry. No rash noted.   Cardiovascular: Normal heart rate noted  Respiratory: Normal respiratory effort, no problems with respiration noted  Abdomen: Soft, gravid, appropriate for gestational age. Pain/Pressure: Absent     Pelvic:  Cervical exam performed        Extremities: Normal range of motion.     Mental Status: Normal mood and affect. Normal behavior. Normal judgment and thought content.    Assessment   32 y.o. [id number] at  [redacted]w[redacted]d by Last Menstrual Period presenting for a routine prenatal visit.  Plan   pregnancy #5 Problems (from 09/07/18 to present)    Problem Noted Resolved   Supervision of other normal pregnancy, antepartum 09/07/2018 by Tresea Mall, CNM No   Overview Addendum 09/08/2018 10:37 AM by Tresea Mall, CNM    Clinic [facility] Prenatal Labs  Dating  Blood type:     Genetic Screen 1 Screen:    AFP:     Quad:     NIPS: Antibody:   Anatomic [place]  Rubella:   Varicella: @VZVIGG @  GTT Early:               Third trimester:  RPR:     Rhogam  HBsAg:     TDaP vaccine   Flu Shot: HIV:     Baby Food Breast                             GBS:   Contraception  Pap: 09/07/18  CBB     CS/VBAC 2009 c/s, 2016 VBAC   Support Person Viviann Spare           Ultrasound today shows singleton IUP, size=dates, visualized anatomy is normal. Needs follow up ultrasound for RVOT, LVOT, nose and lips.  Has not been able to take prenatal vitamins with nausea. Suggested that she try Flintstones chewables.  Sent Rx for Pepcid for heartburn as it is not well-controlled with Tums.  She is not taking any medications for nausea, discussed trying Diclegis. Rx sent.  NOB labs and MaterniT21 today.  Please refer to After Visit Summary for other counseling recommendations.   Return in about 4 weeks (around 10/23/2018) for ROB and follow up anatomy.   Also  schedule separate appointment for GTT as patient ate foods incompatible with test just before her visit today.  Marcelyn BruinsJacelyn Conall Vangorder, CNM 09/25/2018

## 2018-09-25 NOTE — Progress Notes (Signed)
C/o nausea & dry mouth.

## 2018-09-25 NOTE — Patient Instructions (Signed)

## 2018-09-27 LAB — RPR+RH+ABO+RUB AB+AB SCR+CB...
Antibody Screen: NEGATIVE
HIV Screen 4th Generation wRfx: NONREACTIVE
Hematocrit: 34.6 % (ref 34.0–46.6)
Hemoglobin: 12.6 g/dL (ref 11.1–15.9)
Hepatitis B Surface Ag: NEGATIVE
MCH: 33.2 pg — ABNORMAL HIGH (ref 26.6–33.0)
MCHC: 36.4 g/dL — ABNORMAL HIGH (ref 31.5–35.7)
MCV: 91 fL (ref 79–97)
Platelets: 177 10*3/uL (ref 150–450)
RBC: 3.8 x10E6/uL (ref 3.77–5.28)
RDW: 12.2 % (ref 11.7–15.4)
RPR Ser Ql: NONREACTIVE
Rh Factor: POSITIVE
Rubella Antibodies, IGG: 2.68 index (ref >0.99)
Varicella zoster IgG: >4000 index (ref >165)
WBC: 14.1 10*3/uL — ABNORMAL HIGH (ref 3.4–10.8)

## 2018-09-27 LAB — HEMOGLOBINOPATHY EVALUATION
HGB C: 0 %
HGB S: 0 %
HGB VARIANT: 0 %
Hemoglobin A2 Quantitation: 2.6 % (ref 1.8–3.2)
Hemoglobin F Quantitation: 0 % (ref 0.0–2.0)
Hgb A: 97.4 % (ref 96.4–98.8)

## 2018-09-28 ENCOUNTER — Other Ambulatory Visit: Payer: Medicaid Other

## 2018-09-30 LAB — MATERNIT 21 PLUS CORE, BLOOD
Fetal Fraction: 9
Result (T21): NEGATIVE
Trisomy 13 (Patau syndrome): NEGATIVE
Trisomy 18 (Edwards syndrome): NEGATIVE
Trisomy 21 (Down syndrome): NEGATIVE

## 2018-10-11 ENCOUNTER — Other Ambulatory Visit: Payer: Self-pay

## 2018-10-11 ENCOUNTER — Other Ambulatory Visit: Payer: Medicaid Other

## 2018-10-11 DIAGNOSIS — O99210 Obesity complicating pregnancy, unspecified trimester: Secondary | ICD-10-CM

## 2018-10-11 DIAGNOSIS — Z131 Encounter for screening for diabetes mellitus: Secondary | ICD-10-CM

## 2018-10-11 DIAGNOSIS — Z3480 Encounter for supervision of other normal pregnancy, unspecified trimester: Secondary | ICD-10-CM

## 2018-10-12 LAB — GLUCOSE, 1 HOUR GESTATIONAL: Gestational Diabetes Screen: 61 mg/dL — ABNORMAL LOW (ref 65–139)

## 2018-10-17 ENCOUNTER — Other Ambulatory Visit: Payer: Self-pay | Admitting: Obstetrics and Gynecology

## 2018-10-17 ENCOUNTER — Other Ambulatory Visit: Payer: Self-pay | Admitting: Family Medicine

## 2018-10-17 DIAGNOSIS — Z3689 Encounter for other specified antenatal screening: Secondary | ICD-10-CM

## 2018-10-26 ENCOUNTER — Ambulatory Visit (INDEPENDENT_AMBULATORY_CARE_PROVIDER_SITE_OTHER): Payer: Medicaid Other

## 2018-10-26 ENCOUNTER — Ambulatory Visit (INDEPENDENT_AMBULATORY_CARE_PROVIDER_SITE_OTHER): Payer: Medicaid Other | Admitting: Obstetrics and Gynecology

## 2018-10-26 ENCOUNTER — Other Ambulatory Visit: Payer: Self-pay

## 2018-10-26 DIAGNOSIS — Z98891 History of uterine scar from previous surgery: Secondary | ICD-10-CM

## 2018-10-26 DIAGNOSIS — O34219 Maternal care for unspecified type scar from previous cesarean delivery: Secondary | ICD-10-CM

## 2018-10-26 DIAGNOSIS — Z3689 Encounter for other specified antenatal screening: Secondary | ICD-10-CM

## 2018-10-26 DIAGNOSIS — Z362 Encounter for other antenatal screening follow-up: Secondary | ICD-10-CM

## 2018-10-26 DIAGNOSIS — Z3480 Encounter for supervision of other normal pregnancy, unspecified trimester: Secondary | ICD-10-CM

## 2018-10-26 DIAGNOSIS — Z3A21 21 weeks gestation of pregnancy: Secondary | ICD-10-CM

## 2018-10-26 HISTORY — DX: History of uterine scar from previous surgery: Z98.891

## 2018-10-26 NOTE — Progress Notes (Signed)
Routine Prenatal Care Visit  Subjective  Meredith Riggs is a 33 y.o. [id number] at 9471w6d being seen today for ongoing prenatal care.  She is currently monitored for the following issues for this low-risk pregnancy and has Hidradenitis axillaris; Obesity (BMI 30-39.9); Supervision of other normal pregnancy, antepartum; Obesity affecting pregnancy, antepartum; History of cesarean section; and History of VBAC on their problem list.  ----------------------------------------------------------------------------------- Patient reports no complaints.   Contractions: Not present. Vag. Bleeding: None.  Movement: Present. Denies leaking of fluid.  ----------------------------------------------------------------------------------- The following portions of the patient's history were reviewed and updated as appropriate: allergies, current medications, past family history, past medical history, past social history, past surgical history and problem list. Problem list updated.   Objective  Blood pressure 104/62, weight 224 lb (101.6 kg), last menstrual period 05/26/2018, unknown if currently breastfeeding. Pregravid weight 205 lb (93 kg) Total Weight Gain 19 lb (8.618 kg) Urinalysis:      Fetal Status: Fetal Heart Rate (bpm): 150   Movement: Present     General:  Alert, oriented and cooperative. Patient is in no acute distress.  Skin: Skin is warm and dry. No rash noted.   Cardiovascular: Normal heart rate noted  Respiratory: Normal respiratory effort, no problems with respiration noted  Abdomen: Soft, gravid, appropriate for gestational age. Pain/Pressure: Absent     Pelvic:  Cervical exam deferred        Extremities: Normal range of motion.     ental Status: Normal mood and affect. Normal behavior. Normal judgment and thought content.   [place] Ob Follow Up  Result Date: 10/26/2018 Patient Name: Meredith Riggs DOB: 01-Oct-1985 MRN: [id number] ULTRASOUND REPORT Location: [facility] Date of  Service: 10/26/2018 Indications: Anatomy follow up ultrasound Findings: Mason JimSingleton intrauterine pregnancy is visualized with FHR at 144 BPM. Fetal presentation is Breech. Placenta: posterior. Grade: 1 AFI: subjectively normal. Anatomic survey is complete for cardiac/facial views and upper extremities. 3mm echogenic foci within the left ventricle. There is no free peritoneal fluid in the cul de sac. Impression: 1. 4471w6d Viable Singleton Intrauterine pregnancy previously established criteria. 2. Normal Anatomy Scan is now complete Recommendations: 1.Clinical correlation with the patient's History and Physical Exam. Darlina GuysAbby M Clarke, RT There is a singleton gestation with subjectively normal amniotic fluid volume.  Limited evaluation of the fetal anatomy was performed today, focusing on on anatomic structures not fully visualized at the time of prior study.The visualized fetal anatomical survey appears within normal limits within the resolution of ultrasound as described above, and the anatomic survey is now complete.  It must be noted that a normal ultrasound is unable to rule out fetal aneuploidy. An EIF is a small, echogenic area within the fetal cardiac ventricle that has a sonographic brightness equivalent to bone. An EIF is not a structural or functional cardiac abnormality.  An EIF is a common finding, seen in 3-5% of normal fetuses. When an EIF is identified, an experienced provider should perform a detailed fetal anatomic survey and assess for aneuploidy risk factors. Vena AustriaAndreas Malary Aylesworth, MD, Evern CoreFACOG [facility], [facility] 10/26/2018, 11:09 AM     Assessment   32 y.o. [id number] at 871w6d by  03/02/2019, by Last Menstrual Period presenting for routine prenatal visit  Plan   pregnancy #5 Problems (from 09/07/18 to present)    Problem Noted Resolved   Supervision of other normal pregnancy, antepartum 09/07/2018 by Tresea MallGledhill, Jane, CNM No   Overview Addendum 09/08/2018 10:37 AM by Tresea MallGledhill, Jane,  CNM  Clinic [facility] Prenatal Labs  Dating LMP = 17 week [place] Blood type:    O pos  Genetic Screen NIPS: negative XX Antibody: negative  Anatomic [place] Normal (echogenic focus) Rubella:Immune Varicella: Immune  GTT Early: 39  Third trimester:  RPR: NR  Rhogam N/A HBsAg: negative  TDaP vaccine   Flu Shot: HIV: negative  Baby Food Breast                             GBS:   Contraception  Pap: 09/07/18  CBB     CS/VBAC 2009 c/s, 2016 VBAC   Support Person Remo Lipps              Gestational age appropriate obstetric precautions including but not limited to vaginal bleeding, contractions, leaking of fluid and fetal movement were reviewed in detail with the patient.    Return in about 4 weeks (around 11/23/2018) for ROB.  Malachy Mood, MD, [facility], [place]

## 2018-10-26 NOTE — Progress Notes (Signed)
ROB Anatomy scan/ It is a GIRL!! 

## 2018-11-23 ENCOUNTER — Other Ambulatory Visit: Payer: Self-pay

## 2018-11-23 ENCOUNTER — Ambulatory Visit (INDEPENDENT_AMBULATORY_CARE_PROVIDER_SITE_OTHER): Payer: Medicaid Other | Admitting: Certified Nurse Midwife

## 2018-11-23 VITALS — BP 100/60 | Wt 228.0 lb

## 2018-11-23 DIAGNOSIS — Z3480 Encounter for supervision of other normal pregnancy, unspecified trimester: Secondary | ICD-10-CM

## 2018-11-23 DIAGNOSIS — Z113 Encounter for screening for infections with a predominantly sexual mode of transmission: Secondary | ICD-10-CM

## 2018-11-23 DIAGNOSIS — Z130 Encounter for screening for diseases of the blood and blood-forming organs and certain disorders involving the immune mechanism: Secondary | ICD-10-CM

## 2018-11-23 DIAGNOSIS — Z3A25 25 weeks gestation of pregnancy: Secondary | ICD-10-CM

## 2018-11-23 DIAGNOSIS — Z3482 Encounter for supervision of other normal pregnancy, second trimester: Secondary | ICD-10-CM

## 2018-11-23 DIAGNOSIS — Z131 Encounter for screening for diabetes mellitus: Secondary | ICD-10-CM

## 2018-11-25 NOTE — Progress Notes (Signed)
ROB at 25wk6d: No concerns. Baby active.  FHTs WNL, FH 28 cm Desires TOLAC.; had a successful VBAC in 2016. RTO in 2 weeks for ROB and 28 week labs.  Dalia Heading, CNM

## 2018-12-07 ENCOUNTER — Ambulatory Visit (INDEPENDENT_AMBULATORY_CARE_PROVIDER_SITE_OTHER): Payer: Medicaid Other | Admitting: Obstetrics and Gynecology

## 2018-12-07 ENCOUNTER — Encounter: Payer: Self-pay | Admitting: Obstetrics and Gynecology

## 2018-12-07 ENCOUNTER — Other Ambulatory Visit: Payer: Medicaid Other

## 2018-12-07 ENCOUNTER — Other Ambulatory Visit: Payer: Self-pay

## 2018-12-07 VITALS — BP 114/70 | Wt 227.0 lb

## 2018-12-07 DIAGNOSIS — Z113 Encounter for screening for infections with a predominantly sexual mode of transmission: Secondary | ICD-10-CM

## 2018-12-07 DIAGNOSIS — O34219 Maternal care for unspecified type scar from previous cesarean delivery: Secondary | ICD-10-CM

## 2018-12-07 DIAGNOSIS — Z98891 History of uterine scar from previous surgery: Secondary | ICD-10-CM

## 2018-12-07 DIAGNOSIS — Z131 Encounter for screening for diabetes mellitus: Secondary | ICD-10-CM

## 2018-12-07 DIAGNOSIS — Z3A27 27 weeks gestation of pregnancy: Secondary | ICD-10-CM

## 2018-12-07 DIAGNOSIS — Z3480 Encounter for supervision of other normal pregnancy, unspecified trimester: Secondary | ICD-10-CM

## 2018-12-07 DIAGNOSIS — Z130 Encounter for screening for diseases of the blood and blood-forming organs and certain disorders involving the immune mechanism: Secondary | ICD-10-CM

## 2018-12-07 DIAGNOSIS — O99212 Obesity complicating pregnancy, second trimester: Secondary | ICD-10-CM

## 2018-12-07 DIAGNOSIS — O99210 Obesity complicating pregnancy, unspecified trimester: Secondary | ICD-10-CM

## 2018-12-07 NOTE — Progress Notes (Signed)
  Routine Prenatal Care Visit  Subjective  Meredith Riggs is a 33 y.o. [phone] at [redacted]w[redacted]d being seen today for ongoing prenatal care.  She is currently monitored for the following issues for this high-risk pregnancy and has Hidradenitis axillaris; Obesity (BMI 30-39.9); Supervision of other normal pregnancy, antepartum; Obesity affecting pregnancy, antepartum; History of cesarean section; and History of VBAC on their problem list.  ----------------------------------------------------------------------------------- Patient reports no complaints.   Contractions: Not present. Vag. Bleeding: None.  Movement: Present. Denies leaking of fluid.  ----------------------------------------------------------------------------------- The following portions of the patient's history were reviewed and updated as appropriate: allergies, current medications, past family history, past medical history, past social history, past surgical history and problem list. Problem list updated.   Objective  Blood pressure 114/70, weight 227 lb (103 kg), last menstrual period 05/26/2018, unknown if currently breastfeeding. Pregravid weight 205 lb (93 kg) Total Weight Gain 22 lb (9.979 kg) Urinalysis: Urine Protein    Urine Glucose    Fetal Status: Fetal Heart Rate (bpm): 140 Fundal Height: 28 cm Movement: Present     General:  Alert, oriented and cooperative. Patient is in no acute distress.  Skin: Skin is warm and dry. No rash noted.   Cardiovascular: Normal heart rate noted  Respiratory: Normal respiratory effort, no problems with respiration noted  Abdomen: Soft, gravid, appropriate for gestational age. Pain/Pressure: Present     Pelvic:  Cervical exam deferred        Extremities: Normal range of motion.  Edema: None  Mental Status: Normal mood and affect. Normal behavior. Normal judgment and thought content.   Assessment   33 y.o. [id number] at [redacted]w[redacted]d by  03/02/2019, by Last Menstrual Period presenting for routine  prenatal visit  Plan   pregnancy #5 Problems (from 09/07/18 to present)    Problem Noted Resolved   History of cesarean section 10/26/2018 by Malachy Mood, MD No   History of VBAC 10/26/2018 by Malachy Mood, MD No   Obesity affecting pregnancy, antepartum 09/08/2018 by Rod Can, CNM No   Supervision of other normal pregnancy, antepartum 09/07/2018 by Rod Can, CNM No   Overview Addendum 10/26/2018  7:41 PM by Malachy Mood, MD    Clinic [facility] Prenatal Labs  Dating LMP = 17 week [place] Blood type:    O pos  Genetic Screen NIPS: negative XX Antibody: negative  Anatomic [place] Normal (echogenic focus) Rubella:Immune Varicella: Immune  GTT Early: 61  Third trimester:  RPR: NR  Rhogam N/A HBsAg: negative  TDaP vaccine   Flu Shot: HIV: negative  Baby Food Breast                             GBS:   Contraception  Pap: 09/07/18  CBB   Pelvis tested to 7lbs 10oz  CS/VBAC 2009 c/s, 2016 VBAC   Support Person Remo Lipps              Preterm labor symptoms and general obstetric precautions including but not limited to vaginal bleeding, contractions, leaking of fluid and fetal movement were reviewed in detail with the patient. Please refer to After Visit Summary for other counseling recommendations.   28 week labs today  Return in about 2 weeks (around 12/21/2018) for Routine Prenatal Appointment/telephone.  Prentice Docker, MD, [facility], [facility] 12/07/2018 12:10 PM

## 2018-12-08 LAB — 28 WEEK RH+PANEL
Basophils Absolute: 0 10*3/uL (ref 0.0–0.2)
Basos: 0 %
EOS (ABSOLUTE): 0.1 10*3/uL (ref 0.0–0.4)
Eos: 1 %
Gestational Diabetes Screen: 125 mg/dL (ref 65–139)
HIV Screen 4th Generation wRfx: NONREACTIVE
Hematocrit: 30.3 % — ABNORMAL LOW (ref 34.0–46.6)
Hemoglobin: 10.9 g/dL — ABNORMAL LOW (ref 11.1–15.9)
Immature Grans (Abs): 0 10*3/uL (ref 0.0–0.1)
Immature Granulocytes: 0 %
Lymphocytes Absolute: 2.1 10*3/uL (ref 0.7–3.1)
Lymphs: 22 %
MCH: 32.8 pg (ref 26.6–33.0)
MCHC: 36 g/dL — ABNORMAL HIGH (ref 31.5–35.7)
MCV: 91 fL (ref 79–97)
Monocytes Absolute: 0.5 10*3/uL (ref 0.1–0.9)
Monocytes: 5 %
Neutrophils Absolute: 6.8 10*3/uL (ref 1.4–7.0)
Neutrophils: 72 %
Platelets: 148 10*3/uL — ABNORMAL LOW (ref 150–450)
RBC: 3.32 x10E6/uL — ABNORMAL LOW (ref 3.77–5.28)
RDW: 12.4 % (ref 11.7–15.4)
RPR Ser Ql: NONREACTIVE
WBC: 9.6 10*3/uL (ref 3.4–10.8)

## 2018-12-21 ENCOUNTER — Ambulatory Visit (INDEPENDENT_AMBULATORY_CARE_PROVIDER_SITE_OTHER): Payer: Medicaid Other | Admitting: Obstetrics & Gynecology

## 2018-12-21 ENCOUNTER — Other Ambulatory Visit: Payer: Self-pay

## 2018-12-21 ENCOUNTER — Encounter: Payer: Self-pay | Admitting: Obstetrics & Gynecology

## 2018-12-21 DIAGNOSIS — O34219 Maternal care for unspecified type scar from previous cesarean delivery: Secondary | ICD-10-CM | POA: Diagnosis not present

## 2018-12-21 DIAGNOSIS — Z98891 History of uterine scar from previous surgery: Secondary | ICD-10-CM

## 2018-12-21 DIAGNOSIS — O99210 Obesity complicating pregnancy, unspecified trimester: Secondary | ICD-10-CM

## 2018-12-21 DIAGNOSIS — Z3480 Encounter for supervision of other normal pregnancy, unspecified trimester: Secondary | ICD-10-CM

## 2018-12-21 DIAGNOSIS — Z3A29 29 weeks gestation of pregnancy: Secondary | ICD-10-CM

## 2018-12-21 DIAGNOSIS — O99213 Obesity complicating pregnancy, third trimester: Secondary | ICD-10-CM | POA: Diagnosis not present

## 2018-12-21 NOTE — Progress Notes (Signed)
Virtual Visit via Telephone Note  I connected with patient on 12/21/18 at 10:20 AM EDT by telephone and verified that I am speaking with the correct person using two identifiers.   I discussed the limitations, risks, security and privacy concerns of performing an evaluation and management service by telephone and the availability of in person appointments. I also discussed with the patient that there may be a patient responsible charge related to this service. The patient expressed understanding and agreed to proceed.  The patient was at home I spoke with the patient from my  office  Meredith Riggs is a 33 y.o. [phone] at [redacted]w[redacted]d being seen today for ongoing prenatal care.  She is currently monitored for the following issues for this low-risk pregnancy and has Hidradenitis axillaris; Obesity (BMI 30-39.9); Supervision of other normal pregnancy, antepartum; Obesity affecting pregnancy, antepartum; History of cesarean section; and History of VBAC on their problem list.  ----------------------------------------------------------------------------------- Patient reports pubic bone pain and occas pelvic pains like ctxs.   Denies pain, VB, leaking of fluid.  ----------------------------------------------------------------------------------- The following portions of the patient's history were reviewed and updated as appropriate: allergies, current medications, past family history, past medical history, past social history, past surgical history and problem list. Problem list updated.   Objective  Last menstrual period 05/26/2018, unknown if currently breastfeeding. Pregravid weight 205 lb (93 kg) Total Weight Gain 22 lb (9.979 kg)  Physical Exam could not be performed. Because of the COVID-19 outbreak this visit was performed over the phone and not in person.   Assessment   33 y.o. [id number] at [redacted]w[redacted]d by  03/02/2019, by Last Menstrual Period presenting for routine prenatal visit  Plan   pregnancy  #5 Problems (from 09/07/18 to present)    Problem Noted Resolved   History of cesarean section  No   History of VBAC  No   Obesity affecting pregnancy, antepartum  No   Supervision of other normal pregnancy, antepartum  No   Overview Addendum 10/26/2018  7:41 PM by Malachy Mood, MD    Clinic [facility] Prenatal Labs  Dating LMP = 17 week [place] Blood type:    O pos  Genetic Screen NIPS: negative XX Antibody: negative  Anatomic [place] Normal (echogenic focus) Rubella:Immune Varicella: Immune  GTT Early: 61  Third trimester:  RPR: NR  Rhogam N/A HBsAg: negative  TDaP vaccine  nv Flu Shot:Oct20 HIV: negative  Baby Food Breast                             GBS: p  Contraception None desired Pap: 09/07/18  CBB  no Pelvis tested to 7lbs 10oz  CS/VBAC 2009 c/s, 2016 VBAC   Support Person Remo Lipps            PTL precautions discussed       Consider exam and eval if ctxs pains worsen  PNV, [facility]  Desires Vag Delivery again this pregnancy  Gestational age appropriate obstetric precautions including but not limited to vaginal bleeding, contractions, leaking of fluid and fetal movement were reviewed in detail with the patient.     Follow Up Instructions: 2 weeks   I discussed the assessment and treatment plan with the patient. The patient was provided an opportunity to ask questions and all were answered. The patient agreed with the plan and demonstrated an understanding of the instructions.   The patient was advised to call back or seek an in-person evaluation if the symptoms  worsen or if the condition fails to improve as anticipated.  I provided 7 minutes of non-face-to-face time during this encounter.  Return in about 2 weeks (around 01/04/2019) for ROB.  Annamarie MajorPaul Ebany Bowermaster, MD [facility], [facility] 12/21/2018 10:45 AM

## 2019-01-04 ENCOUNTER — Encounter: Payer: Medicaid Other | Admitting: Certified Nurse Midwife

## 2019-01-05 ENCOUNTER — Observation Stay
Admission: EM | Admit: 2019-01-05 | Discharge: 2019-01-05 | Disposition: A | Discharge status: Home / Self Care | Payer: BC Managed Care – PPO | Attending: Certified Nurse Midwife | Admitting: Certified Nurse Midwife

## 2019-01-05 ENCOUNTER — Other Ambulatory Visit: Payer: Self-pay

## 2019-01-05 DIAGNOSIS — O26893 Other specified pregnancy related conditions, third trimester: Secondary | ICD-10-CM | POA: Diagnosis not present

## 2019-01-05 DIAGNOSIS — Z3A32 32 weeks gestation of pregnancy: Secondary | ICD-10-CM | POA: Insufficient documentation

## 2019-01-05 DIAGNOSIS — R109 Unspecified abdominal pain: Secondary | ICD-10-CM | POA: Diagnosis not present

## 2019-01-05 DIAGNOSIS — Z3480 Encounter for supervision of other normal pregnancy, unspecified trimester: Secondary | ICD-10-CM

## 2019-01-05 DIAGNOSIS — O99210 Obesity complicating pregnancy, unspecified trimester: Secondary | ICD-10-CM

## 2019-01-05 DIAGNOSIS — Z98891 History of uterine scar from previous surgery: Secondary | ICD-10-CM

## 2019-01-05 NOTE — Final Progress Note (Signed)
Physician Final Progress Note  Patient ID: Meredith Riggs MRN: [id number] DOB/AGE: 31/02/1986 31 y.o.  Admit date: 01/05/2019 Admitting provider: Malachy Mood, MD/ Jesus Genera. Danise Mina, CNM Discharge date: 01/05/2019   Admission Diagnoses: IUP at 32 weeks Abdominal pain in pregnancy  Discharge Diagnoses: Left round ligament pain  Consults: None  Significant Findings/ Diagnostic Studies:   HPI:  Meredith Riggs is a 33 y.o. [phone] female with EDC=03/02/2019  at [redacted]w[redacted]d dated by her LMP..  Her pregnancy has been complicated by obesity and a hx of a prior Cesarean section and subsequent VBAC..  She presents to L&D for evaluation of left sided abdominal pain. The pain began at .1900 when she was getting up from sitting. It was constant and sharp initially and she rated the pain a 9 out of 10.  THe pain has subsided now since she arrived at the hospital and is resting. Baby active. Denies any leakage of fluid, vaginal bleeding, dysuria, diarrhea.  Prenatal care site: Prenatal care at [employer] has also been remarkable for  [place] Prenatal Labs  Dating LMP = 17 week [place] Blood type:    O pos  Genetic Screen NIPS: negative XX Antibody: negative  Anatomic [place] Normal (echogenic focus) Rubella:Immune Varicella: Immune  GTT Early: 61  Third trimester:  RPR: NR  Rhogam N/A HBsAg: negative  TDaP vaccine   Flu Shot: HIV: negative  Baby Food Breast                             GBS:   Contraception  Pap: 09/07/18  CBB   Pelvis tested to 7lbs 10oz  CS/VBAC 2009 c/s, 2016 VBAC   Support Person Remo Lipps         Maternal Medical History:   Past Medical History:  Diagnosis Date  . Herpes genitalis   . Vaginal Pap smear, abnormal    Pt. don't know when the last one was abn.     Past Surgical History:  Procedure Laterality Date  . CESAREAN SECTION    . HYDRADENITIS EXCISION Left 10/15/2015   Procedure: EXCISION HIDRADENITIS AXILLA;  Surgeon: Florene Glen, MD;  Location:  [facility] ORS;  Service: General;  Laterality: Left;  . INDUCED ABORTION      No Known Allergies  Prior to Admission medications   Not on File    Social History: She  reports that she has never smoked. She has never used smokeless tobacco. She reports that she does not drink alcohol or use drugs.  Family History: family history includes Cancer in her maternal uncle.   Review of Systems: Negative x 10 systems reviewed except as noted in the HPI.      Physical Exam:  Vital Signs: BP 109/60 (BP Location: Right Arm)   Pulse 93   Temp 98.2 F (36.8 C) (Oral)   Resp 16   Ht 5\' 7"  (1.702 m)   Wt 99.8 kg   LMP 05/26/2018 (Within Days)   BMI 34.46 kg/m  General: no acute distress.  HEENT: normocephalic, atraumatic Heart: regular rate  Lungs: normal respiratory effort Abdomen: soft, gravid, tenderness over border of uterus on left, fundus NT Pelvic:   External: Normal external female genitalia  Cervix: Dilation: Closed / Effacement (%): Thick / Station: Ballotable   Extremities: non-tender, symmetric, trace  edema bilaterally.    Neurologic: Alert & oriented x 3.   Baseline FHR: 125-130 baseline with accelerations to 150s, moderate variability Toco:  occasional 20-30 sec contraction     Assessment/Plan:  [name] is a 33 y.o. [phone] female at 7311w0d with probable left round ligament pain. No evidence of preterm labor.  Meredith Riggs had been wearing a maternity support belt earlier in her pregnancy , but stopped wearing because she did not think it made a difference. Encouraged to resume wearing, particularly at work.  Can also apply heat or take Tylenol and rest. Patient requested discharge after pain improved.  Procedures: none  Discharge Condition: stable  Disposition:   Diet: Regular diet  Discharge Activity: Activity as tolerated   Allergies as of 01/05/2019   No Known Allergies     Medication List    You have not been prescribed any medications.       Total time spent taking care of this patient: 15 minutes  Signed: Farrel Connersolleen Cliffton Spradley 01/05/2019, 9:35 PM

## 2019-01-05 NOTE — OB Triage Note (Signed)
Pt is a 32y/o at [redacted]w[redacted]d with c/o ctx that began 1900 and rated the as 9/10 sharp stabbing in the top and left parts of the belly. Pt states +FM. Pt denies LOF, and VB. Monitors applied and assessing. Initial FHT 130.

## 2019-01-09 ENCOUNTER — Other Ambulatory Visit: Payer: Self-pay

## 2019-01-09 ENCOUNTER — Encounter: Payer: Self-pay | Admitting: Maternal Newborn

## 2019-01-09 ENCOUNTER — Ambulatory Visit (INDEPENDENT_AMBULATORY_CARE_PROVIDER_SITE_OTHER): Payer: Medicaid Other | Admitting: Maternal Newborn

## 2019-01-09 DIAGNOSIS — O34219 Maternal care for unspecified type scar from previous cesarean delivery: Secondary | ICD-10-CM | POA: Diagnosis not present

## 2019-01-09 DIAGNOSIS — Z3A32 32 weeks gestation of pregnancy: Secondary | ICD-10-CM

## 2019-01-09 DIAGNOSIS — M549 Dorsalgia, unspecified: Secondary | ICD-10-CM | POA: Diagnosis not present

## 2019-01-09 DIAGNOSIS — Z3480 Encounter for supervision of other normal pregnancy, unspecified trimester: Secondary | ICD-10-CM

## 2019-01-09 DIAGNOSIS — O9989 Other specified diseases and conditions complicating pregnancy, childbirth and the puerperium: Secondary | ICD-10-CM | POA: Diagnosis not present

## 2019-01-09 NOTE — Progress Notes (Signed)
ROB- pt was at ED on 8/21 and is having a lot of back pain, and complains of vaginal pain and pelvic pressure

## 2019-01-09 NOTE — Progress Notes (Signed)
Virtual Visit via Telephone Note  I connected with Meredith Riggs on 01/09/19 at 11:42 AM EDT by telephone and verified that I am speaking with the correct person using two identifiers.  Location: Patient: Home Provider: Office   This appointment was scheduled as an in-person visit, but she was unable to come in to the office today due to transportation issues. She has previously had a prenatal telephone visit and agreed to proceed today.  See below for documentation of the visit:   Subjective  Meredith Riggs is a 33 y.o. [id number] at [redacted]w[redacted]d being seen today for ongoing prenatal care.  She is currently monitored for the following issues for this low-risk pregnancy and has Hidradenitis axillaris; Obesity (BMI 30-39.9); Supervision of other normal pregnancy, antepartum; Obesity affecting pregnancy, antepartum; History of cesarean section; History of VBAC; and Abdominal pain during pregnancy in third trimester on their problem list.  ----------------------------------------------------------------------------------- Patient reports pelvic girdle pain, back pain, pelvic pressure, and frequent Braxton Hicks contractions. She was seen in triage on 8/21 and diagnosed with round ligament pain.  Contractions: Irritability. Vag. Bleeding: None.  Movement: Present. No leaking of fluid.  ----------------------------------------------------------------------------------- The following portions of the patient's history were reviewed and updated as appropriate: allergies, current medications, past family history, past medical history, past social history, past surgical history and problem list. Problem list updated.   Objective  Last menstrual period 05/26/2018, unknown if currently breastfeeding. Pregravid weight 205 lb (93 kg) Total Weight Gain 15 lb (6.804 kg)  Fetal Status:     Movement: Present     Physical exam could not be performed as this was a telephone visit  Assessment   33 y.o.  [id number] at [redacted]w[redacted]d, EDD 03/02/2019 by Last Menstrual Period presenting for a prenatal telephone visit.  Plan   pregnancy #5 Problems (from 09/07/18 to present)    Problem Noted Resolved   History of cesarean section 10/26/2018 by Malachy Mood, MD No   History of VBAC 10/26/2018 by Malachy Mood, MD No   Obesity affecting pregnancy, antepartum 09/08/2018 by Rod Can, CNM No   Supervision of other normal pregnancy, antepartum 09/07/2018 by Rod Can, CNM No   Overview Addendum 01/05/2019  9:39 PM by Dalia Heading, [place] Prenatal Labs  Dating LMP = 17 week [place] Blood type:    O pos  Genetic Screen NIPS: negative XX Antibody: negative  Anatomic [place] Normal (echogenic focus) Rubella:Immune Varicella: Immune  GTT Early: 61  Third trimester:  RPR: NR  Rhogam N/A HBsAg: negative  TDaP vaccine   Flu Shot: HIV: negative  Baby Food Breast                             GBS:   Contraception  Pap: 09/07/18  CBB   Pelvis tested to 7lbs 10oz  CS/VBAC 2009 c/s, 2016 VBAC   Support Person Aggie Moats increasing hydration to help with frequent [address]. Discussed comfort measures for back pain such as extra strength Tylenol, Biofreeze, heating pad. Could do PT consult but it would be difficult for her to get to an appointment.  Asked her to come in for an exam in the next few days if possible, and to go to [facility] triage with signs of preterm labor.  I discussed the assessment and treatment plan with the patient. The patient was provided an opportunity to ask questions  and all were answered.    The patient was advised to call back or seek an in-person evaluation if the symptoms worsen or if the condition fails to improve as anticipated.  I provided 8:30 minutes of non-face-to-face time during this encounter.   Preterm labor symptoms and general obstetric precautions including but not limited to vaginal bleeding, contractions, leaking of fluid and fetal  movement were reviewed.  Please refer to After Visit Summary for other counseling recommendations.   Return in about 1 day (around 01/10/2019) for ROB in person.  Meredith BruinsJacelyn Riggs, CNM 01/09/2019  11:54 AM

## 2019-01-09 NOTE — Patient Instructions (Signed)
Third Trimester of Pregnancy The third trimester is from week 28 through week 40 (months 7 through 9). The third trimester is a time when the unborn baby (fetus) is growing rapidly. At the end of the ninth month, the fetus is about 20 inches in length and weighs 6-10 pounds. Body changes during your third trimester Your body will continue to go through many changes during pregnancy. The changes vary from woman to woman. During the third trimester:  Your weight will continue to increase. You can expect to gain 25-35 pounds (11-16 kg) by the end of the pregnancy.  You may begin to get stretch marks on your hips, abdomen, and breasts.  You may urinate more often because the fetus is moving lower into your pelvis and pressing on your bladder.  You may develop or continue to have heartburn. This is caused by increased hormones that slow down muscles in the digestive tract.  You may develop or continue to have constipation because increased hormones slow digestion and cause the muscles that push waste through your intestines to relax.  You may develop hemorrhoids. These are swollen veins (varicose veins) in the rectum that can itch or be painful.  You may develop swollen, bulging veins (varicose veins) in your legs.  You may have increased body aches in the pelvis, back, or thighs. This is due to weight gain and increased hormones that are relaxing your joints.  You may have changes in your hair. These can include thickening of your hair, rapid growth, and changes in texture. Some women also have hair loss during or after pregnancy, or hair that feels dry or thin. Your hair will most likely return to normal after your baby is born.  Your breasts will continue to grow and they will continue to become tender. A yellow fluid (colostrum) may leak from your breasts. This is the first milk you are producing for your baby.  Your belly button may stick out.  You may notice more swelling in your hands,  face, or ankles.  You may have increased tingling or numbness in your hands, arms, and legs. The skin on your belly may also feel numb.  You may feel short of breath because of your expanding uterus.  You may have more problems sleeping. This can be caused by the size of your belly, increased need to urinate, and an increase in your body's metabolism.  You may notice the fetus "dropping," or moving lower in your abdomen (lightening).  You may have increased vaginal discharge.  You may notice your joints feel loose and you may have pain around your pelvic bone. What to expect at prenatal visits You will have prenatal exams every 2 weeks until week 36. Then you will have weekly prenatal exams. During a routine prenatal visit:  You will be weighed to make sure you and the baby are growing normally.  Your blood pressure will be taken.  Your abdomen will be measured to track your baby's growth.  The fetal heartbeat will be listened to.  Any test results from the previous visit will be discussed.  You may have a cervical check near your due date to see if your cervix has softened or thinned (effaced).  You will be tested for Group B streptococcus. This happens between 35 and 37 weeks. Your health care provider may ask you:  What your birth plan is.  How you are feeling.  If you are feeling the baby move.  If you have had any abnormal   symptoms, such as leaking fluid, bleeding, severe headaches, or abdominal cramping.  If you are using any tobacco products, including cigarettes, chewing tobacco, and electronic cigarettes.  If you have any questions. Other tests or screenings that may be performed during your third trimester include:  Blood tests that check for low iron levels (anemia).  Fetal testing to check the health, activity level, and growth of the fetus. Testing is done if you have certain medical conditions or if there are problems during the pregnancy.  Nonstress test  (NST). This test checks the health of your baby to make sure there are no signs of problems, such as the baby not getting enough oxygen. During this test, a belt is placed around your belly. The baby is made to move, and its heart rate is monitored during movement. What is false labor? False labor is a condition in which you feel small, irregular tightenings of the muscles in the womb (contractions) that usually go away with rest, changing position, or drinking water. These are called Braxton Hicks contractions. Contractions may last for hours, days, or even weeks before true labor sets in. If contractions come at regular intervals, become more frequent, increase in intensity, or become painful, you should see your health care provider. What are the signs of labor?  Abdominal cramps.  Regular contractions that start at 10 minutes apart and become stronger and more frequent with time.  Contractions that start on the top of the uterus and spread down to the lower abdomen and back.  Increased pelvic pressure and dull back pain.  A watery or bloody mucus discharge that comes from the vagina.  Leaking of amniotic fluid. This is also known as your "water breaking." It could be a slow trickle or a gush. Let your health care provider know if it has a color or strange odor. If you have any of these signs, call your health care provider right away, even if it is before your due date. Follow these instructions at home: Medicines  Follow your health care provider's instructions regarding medicine use. Specific medicines may be either safe or unsafe to take during pregnancy.  Take a prenatal vitamin that contains at least 600 micrograms (mcg) of folic acid.  If you develop constipation, try taking a stool softener if your health care provider approves. Eating and drinking   Eat a balanced diet that includes fresh fruits and vegetables, whole grains, good sources of protein such as meat, eggs, or tofu,  and low-fat dairy. Your health care provider will help you determine the amount of weight gain that is right for you.  Avoid raw meat and uncooked cheese. These carry germs that can cause birth defects in the baby.  If you have low calcium intake from food, talk to your health care provider about whether you should take a daily calcium supplement.  Eat four or five small meals rather than three large meals a day.  Limit foods that are high in fat and processed sugars, such as fried and sweet foods.  To prevent constipation: ? Drink enough fluid to keep your urine clear or pale yellow. ? Eat foods that are high in fiber, such as fresh fruits and vegetables, whole grains, and beans. Activity  Exercise only as directed by your health care provider. Most women can continue their usual exercise routine during pregnancy. Try to exercise for 30 minutes at least 5 days a week. Stop exercising if you experience uterine contractions.  Avoid heavy lifting.  Do   not exercise in extreme heat or humidity, or at high altitudes.  Wear low-heel, comfortable shoes.  Practice good posture.  You may continue to have sex unless your health care provider tells you otherwise. Relieving pain and discomfort  Take frequent breaks and rest with your legs elevated if you have leg cramps or low back pain.  Take warm sitz baths to soothe any pain or discomfort caused by hemorrhoids. Use hemorrhoid cream if your health care provider approves.  Wear a good support bra to prevent discomfort from breast tenderness.  If you develop varicose veins: ? Wear support pantyhose or compression stockings as told by your healthcare provider. ? Elevate your feet for 15 minutes, 3-4 times a day. Prenatal care  Write down your questions. Take them to your prenatal visits.  Keep all your prenatal visits as told by your health care provider. This is important. Safety  Wear your seat belt at all times when driving.  Make  a list of emergency phone numbers, including numbers for family, friends, the hospital, and police and fire departments. General instructions  Avoid cat litter boxes and soil used by cats. These carry germs that can cause birth defects in the baby. If you have a cat, ask someone to clean the litter box for you.  Do not travel far distances unless it is absolutely necessary and only with the approval of your health care provider.  Do not use hot tubs, steam rooms, or saunas.  Do not drink alcohol.  Do not use any products that contain nicotine or tobacco, such as cigarettes and e-cigarettes. If you need help quitting, ask your health care provider.  Do not use any medicinal herbs or unprescribed drugs. These chemicals affect the formation and growth of the baby.  Do not douche or use tampons or scented sanitary pads.  Do not cross your legs for long periods of time.  To prepare for the arrival of your baby: ? Take prenatal classes to understand, practice, and ask questions about labor and delivery. ? Make a trial run to the hospital. ? Visit the hospital and tour the maternity area. ? Arrange for maternity or paternity leave through employers. ? Arrange for family and friends to take care of pets while you are in the hospital. ? Purchase a rear-facing car seat and make sure you know how to install it in your car. ? Pack your hospital bag. ? Prepare the baby's nursery. Make sure to remove all pillows and stuffed animals from the baby's crib to prevent suffocation.  Visit your dentist if you have not gone during your pregnancy. Use a soft toothbrush to brush your teeth and be gentle when you floss. Contact a health care provider if:  You are unsure if you are in labor or if your water has broken.  You become dizzy.  You have mild pelvic cramps, pelvic pressure, or nagging pain in your abdominal area.  You have lower back pain.  You have persistent nausea, vomiting, or diarrhea.   You have an unusual or bad smelling vaginal discharge.  You have pain when you urinate. Get help right away if:  Your water breaks before 37 weeks.  You have regular contractions less than 5 minutes apart before 37 weeks.  You have a fever.  You are leaking fluid from your vagina.  You have spotting or bleeding from your vagina.  You have severe abdominal pain or cramping.  You have rapid weight loss or weight gain.  You have   shortness of breath with chest pain.  You notice sudden or extreme swelling of your face, hands, ankles, feet, or legs.  Your baby makes fewer than 10 movements in 2 hours.  You have severe headaches that do not go away when you take medicine.  You have vision changes. Summary  The third trimester is from week 28 through week 40, months 7 through 9. The third trimester is a time when the unborn baby (fetus) is growing rapidly.  During the third trimester, your discomfort may increase as you and your baby continue to gain weight. You may have abdominal, leg, and back pain, sleeping problems, and an increased need to urinate.  During the third trimester your breasts will keep growing and they will continue to become tender. A yellow fluid (colostrum) may leak from your breasts. This is the first milk you are producing for your baby.  False labor is a condition in which you feel small, irregular tightenings of the muscles in the womb (contractions) that eventually go away. These are called Braxton Hicks contractions. Contractions may last for hours, days, or even weeks before true labor sets in.  Signs of labor can include: abdominal cramps; regular contractions that start at 10 minutes apart and become stronger and more frequent with time; watery or bloody mucus discharge that comes from the vagina; increased pelvic pressure and dull back pain; and leaking of amniotic fluid. This information is not intended to replace advice given to you by your health  care provider. Make sure you discuss any questions you have with your health care provider. Document Released: 04/27/2001 Document Revised: 08/24/2018 Document Reviewed: 06/08/2016 [employer] Patient Education  2020 [employer]  

## 2019-01-11 ENCOUNTER — Ambulatory Visit (INDEPENDENT_AMBULATORY_CARE_PROVIDER_SITE_OTHER): Payer: Medicaid Other | Admitting: Advanced Practice Midwife

## 2019-01-11 ENCOUNTER — Encounter: Payer: Self-pay | Admitting: Advanced Practice Midwife

## 2019-01-11 ENCOUNTER — Other Ambulatory Visit: Payer: Self-pay

## 2019-01-11 VITALS — BP 100/60

## 2019-01-11 DIAGNOSIS — Z3A32 32 weeks gestation of pregnancy: Secondary | ICD-10-CM

## 2019-01-11 DIAGNOSIS — O34219 Maternal care for unspecified type scar from previous cesarean delivery: Secondary | ICD-10-CM

## 2019-01-11 LAB — POCT URINALYSIS DIPSTICK OB: Glucose, UA: NEGATIVE

## 2019-01-11 NOTE — Progress Notes (Signed)
Routine Prenatal Care Visit  Subjective  Meredith Riggs is a 33 y.o. [phone] at 2958w6d being seen today for ongoing prenatal care.  She is currently monitored for the following issues for this low-risk pregnancy and has Hidradenitis axillaris; Obesity (BMI 30-39.9); Supervision of other normal pregnancy, antepartum; Obesity affecting pregnancy, antepartum; History of cesarean section; History of VBAC; and Abdominal pain during pregnancy in third trimester on their problem list.  ----------------------------------------------------------------------------------- Patient reports low back ache, pelvic pressure, braxton hicks ctx's, difficulty sleeping. Reviewed comfort measures. She says abdominal support band has not been helpful. She has not tried tub soaks.  She requests cervical exam. She is wondering when she can get out of work. Discussed unless there is a medical reason, we would usually recommend no earlier than 36 weeks and that it is primarily her preference. She will wait until next visit for out of work note.  Contractions: Irritability. Vag. Bleeding: None.  Movement: Present. Leaking Fluid denies.  ----------------------------------------------------------------------------------- The following portions of the patient's history were reviewed and updated as appropriate: allergies, current medications, past family history, past medical history, past social history, past surgical history and problem list. Problem list updated.  Objective  Blood pressure 100/60, last menstrual period 05/26/2018 Pregravid weight 205 lb (93 kg) Total Weight Gain 15 lb (6.804 kg) Urinalysis: Urine Protein    Urine Glucose    Fetal Status: Fetal Heart Rate (bpm): 130 Fundal Height: 33 cm Movement: Present     General:  Alert, oriented and cooperative. Patient is in no acute distress.  Skin: Skin is warm and dry. No rash noted.   Cardiovascular: Normal heart rate noted  Respiratory: Normal respiratory  effort, no problems with respiration noted  Abdomen: Soft, gravid, appropriate for gestational age. Pain/Pressure: Present     Pelvic:  Cervical exam performed Dilation: Closed Effacement (%): Thick Station: Ballotable  Extremities: Normal range of motion.  Edema: None  Mental Status: Normal mood and affect. Normal behavior. Normal judgment and thought content.   Assessment   33 y.o. [id number] at 2858w6d by  03/02/2019, by Last Menstrual Period presenting for routine prenatal visit  Plan   pregnancy #5 Problems (from 09/07/18 to present)    Problem Noted Resolved   History of cesarean section 10/26/2018 by Vena AustriaStaebler, Andreas, MD No   History of VBAC 10/26/2018 by Vena AustriaStaebler, Andreas, MD No   Obesity affecting pregnancy, antepartum 09/08/2018 by Tresea MallGledhill, Sharmin Foulk, CNM No   Supervision of other normal pregnancy, antepartum 09/07/2018 by Tresea MallGledhill, Jaree Dwight, CNM No   Overview Addendum 01/05/2019  9:39 PM by Farrel ConnersGutierrez, Colleen, CNM    Clinic [facility] Prenatal Labs  Dating LMP = 17 week US Blood type:    O pos  Genetic Screen NIPS: negative XX Antibody: negative  Anatomic US Normal (echogenic focus) Rubella:Immune Varicella: Immune  GTT Early: 61  Third trimester:  RPR: NR  Rhogam N/A HBsAg: negative  TDaP vaccine   Flu Shot: HIV: negative  Baby Food Breast                             GBS:   Contraception  Pap: 09/07/18  CBB   Pelvis tested to 7lbs 10oz  CS/VBAC 2009 c/s, 2016 VBAC   Support Person Viviann SpareSteven              Preterm labor symptoms and general obstetric precautions including but not limited to vaginal bleeding, contractions, leaking of fluid and fetal movement were reviewed in  detail with the patient. Discomforts of third trimester: comfort measures reviewed   Return in about 2 weeks (around 01/25/2019) for rob.  Rod Can, CNM 01/11/2019 9:46 AM

## 2019-01-11 NOTE — Addendum Note (Signed)
Addended by: Quintella Baton D on: 01/11/2019 09:57 AM   Modules accepted: Orders

## 2019-01-25 ENCOUNTER — Other Ambulatory Visit: Payer: Self-pay

## 2019-01-25 ENCOUNTER — Ambulatory Visit (INDEPENDENT_AMBULATORY_CARE_PROVIDER_SITE_OTHER): Payer: Medicaid Other | Admitting: Advanced Practice Midwife

## 2019-01-25 ENCOUNTER — Encounter: Payer: Self-pay | Admitting: Advanced Practice Midwife

## 2019-01-25 VITALS — BP 114/70 | Wt 232.0 lb

## 2019-01-25 DIAGNOSIS — Z3483 Encounter for supervision of other normal pregnancy, third trimester: Secondary | ICD-10-CM

## 2019-01-25 DIAGNOSIS — Z3A34 34 weeks gestation of pregnancy: Secondary | ICD-10-CM

## 2019-01-25 DIAGNOSIS — Z3480 Encounter for supervision of other normal pregnancy, unspecified trimester: Secondary | ICD-10-CM

## 2019-01-25 NOTE — Progress Notes (Signed)
  Routine Prenatal Care Visit  Subjective  Meredith Riggs is a 33 y.o. [id number] at [redacted]w[redacted]d being seen today for ongoing prenatal care.  She is currently monitored for the following issues for this high-risk pregnancy and has Hidradenitis axillaris; Obesity (BMI 30-39.9); Supervision of other normal pregnancy, antepartum; Obesity affecting pregnancy, antepartum; History of cesarean section; History of VBAC; and Abdominal pain during pregnancy in third trimester on their problem list.  ----------------------------------------------------------------------------------- Patient reports she is unable to continue doing her current job responsibilities due to pain in her back. 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   Contractions: Irritability. Vag. Bleeding: None.  Movement: Present. Leaking Fluid denies.  ----------------------------------------------------------------------------------- The following portions of the patient's history were reviewed and updated as appropriate: allergies, current medications, past family history, past medical history, past social history, past surgical history and problem list. Problem list updated.  Objective  Blood pressure 114/70, weight 232 lb (105.2 kg), last menstrual period 05/26/2018, unknown if currently breastfeeding. Pregravid weight 205 lb (93 kg) Total Weight Gain 27 lb (12.2 kg) Urinalysis: Urine Protein    Urine Glucose    Fetal Status: Fetal Heart Rate (bpm): 137   Movement: Present     General:  Alert, oriented and cooperative. Patient is in no acute distress.  Skin: Skin is warm and dry. No rash noted.   Cardiovascular: Normal heart rate noted  Respiratory: Normal respiratory effort, no problems with respiration noted  Abdomen: Soft, gravid, appropriate for gestational age. Pain/Pressure: Present     Pelvic:  Cervical exam deferred        Extremities: Normal range of motion.  Edema: None  Mental Status: Normal mood and affect.  Normal behavior. Normal judgment and thought content.   Assessment   33 y.o. [id number] at [redacted]w[redacted]d by  03/02/2019, by Last Menstrual Period presenting for routine prenatal visit  Plan   pregnancy #5 Problems (from 09/07/18 to present)    Problem Noted Resolved   History of cesarean section 10/26/2018 by Malachy Mood, MD No   History of VBAC 10/26/2018 by Malachy Mood, MD No   Obesity affecting pregnancy, antepartum 09/08/2018 by Rod Can, CNM No   Supervision of other normal pregnancy, antepartum 09/07/2018 by Rod Can, CNM No   Overview Addendum 01/05/2019  9:39 PM by Dalia Heading, [place] Prenatal Labs  Dating LMP = 17 week [place] Blood type:    O pos  Genetic Screen NIPS: negative XX Antibody: negative  Anatomic [place] Normal (echogenic focus) Rubella:Immune Varicella: Immune  GTT Early: 61  Third trimester:  RPR: NR  Rhogam N/A HBsAg: negative  TDaP vaccine   Flu Shot: HIV: negative  Baby Food Breast                             GBS:   Contraception  Pap: 09/07/18  CBB   Pelvis tested to 7lbs 10oz  CS/VBAC 2009 c/s, 2016 VBAC   Support Person Remo Lipps              Preterm labor symptoms and general obstetric precautions including but not limited to vaginal bleeding, contractions, leaking of fluid and fetal movement were reviewed in detail with the patient. Please refer to After Visit Summary for other counseling recommendations.   Return in about 2 weeks (around 02/08/2019) for rob.  Rod Can, CNM 01/25/2019 11:18 AM

## 2019-01-25 NOTE — Progress Notes (Signed)
Pt having pain and discomfort. No vb. No lof.

## 2019-01-25 NOTE — Patient Instructions (Signed)
Vaginal Birth After Cesarean Delivery  Vaginal birth after cesarean delivery (VBAC) is giving birth vaginally after previously delivering a baby through a cesarean section (C-section). A VBAC may be a safe option for you, depending on your health and other factors. It is important to discuss VBAC with your health care provider early in your pregnancy so you can understand the risks, benefits, and options. Having these discussions early will give you time to make your birth plan. Who are the best candidates for VBAC? The best candidates for VBAC are women who:  Have had one or two prior cesarean deliveries, and the incision made during the delivery was horizontal (low transverse).  Do not have a vertical (classical) scar on their uterus.  Have not had a tear in the wall of their uterus (uterine rupture).  Plan to have more pregnancies. A VBAC is also more likely to be successful:  In women who have previously given birth vaginally.  When labor starts by itself (spontaneously) before the due date. What are the benefits of VBAC? The benefits of delivering your baby vaginally instead of by a cesarean delivery include:  A shorter hospital stay.  A faster recovery time.  Less pain.  Avoiding risks associated with major surgery, such as infection and blood clots.  Less blood loss and less need for donated blood (transfusions). What are the risks of VBAC? The main risk of attempting a VBAC is that it may fail, forcing your health care provider to deliver your baby by a C-section. Other risks are rare and include:  Tearing (rupture) of the scar from a past cesarean delivery.  Other risks associated with vaginal deliveries. If a repeat cesarean delivery is needed, the risks include:  Blood loss.  Infection.  Blood clot.  Damage to surrounding organs.  Removal of the uterus (hysterectomy), if it is damaged.  Placenta problems in future pregnancies. What else should I know  about my options? Delivering a baby through a VBAC is similar to having a normal spontaneous vaginal delivery. Therefore, it is safe:  To try with twins.  For your health care provider to try to turn the baby from a breech position (external cephalic version) during labor.  With epidural analgesia for pain relief. Consider where you would like to deliver your baby. VBAC should be attempted in facilities where an emergency cesarean delivery can be performed. VBAC is not recommended for home births. Any changes in your health or your baby's health during your pregnancy may make it necessary to change your initial decision about VBAC. Your health care provider may recommend that you do not attempt a VBAC if:  Your baby's suspected weight is 8.8 lb (4 kg) or more.  You have preeclampsia. This is a condition that causes high blood pressure along with other symptoms, such as swelling and headaches.  You will have VBAC less than 19 months after your cesarean delivery.  You are past your due date.  You need to have labor started (induced) because your cervix is not ready for labor (unfavorable). Where to find more information  American Pregnancy Association: americanpregnancy.org  American Congress of Obstetricians and Gynecologists: acog.org Summary  Vaginal birth after cesarean delivery (VBAC) is giving birth vaginally after previously delivering a baby through a cesarean section (C-section). A VBAC may be a safe option for you, depending on your health and other factors.  Discuss VBAC with your health care provider early in your pregnancy so you can understand the risks, benefits, options, and   have plenty of time to make your birth plan.  The main risk of attempting a VBAC is that it may fail, forcing your health care provider to deliver your baby by a C-section. Other risks are rare. This information is not intended to replace advice given to you by your health care provider. Make sure  you discuss any questions you have with your health care provider. Document Released: 10/24/2006 Document Revised: 08/29/2018 Document Reviewed: 08/10/2016 Elsevier Patient Education  2020 Elsevier Inc. [employer] of the uterus can occur throughout pregnancy, but they are not always a sign that you are in labor. You may have practice contractions called Braxton Hicks contractions. These false labor contractions are sometimes confused with true labor. What are [place] contractions? Braxton Hicks contractions are tightening movements that occur in the muscles of the uterus before labor. Unlike true labor contractions, these contractions do not result in opening (dilation) and thinning of the cervix. Toward the end of pregnancy (32-34 weeks), Braxton Hicks contractions can happen more often and may become stronger. These contractions are sometimes difficult to tell apart from true labor because they can be very uncomfortable. You should not feel embarrassed if you go to the hospital with false labor. Sometimes, the only way to tell if you are in true labor is for your health care provider to look for changes in the cervix. The health care provider will do a physical exam and may monitor your contractions. If you are not in true labor, the exam should show that your cervix is not dilating and your water has not broken. If there are no other health problems associated with your pregnancy, it is completely safe for you to be sent home with false labor. You may continue to have Braxton Hicks contractions until you go into true labor. How to tell the difference between true labor and false labor True labor  Contractions last 30-70 seconds.  Contractions become very regular.  Discomfort is usually felt in the top of the uterus, and it spreads to the lower abdomen and low back.  Contractions do not go away with walking.  Contractions usually become more intense and  increase in frequency.  The cervix dilates and gets thinner. False labor  Contractions are usually shorter and not as strong as true labor contractions.  Contractions are usually irregular.  Contractions are often felt in the front of the lower abdomen and in the groin.  Contractions may go away when you walk around or change positions while lying down.  Contractions get weaker and are shorter-lasting as time goes on.  The cervix usually does not dilate or become thin. Follow these instructions at home:   Take over-the-counter and prescription medicines only as told by your health care provider.  Keep up with your usual exercises and follow other instructions from your health care provider.  Eat and drink lightly if you think you are going into labor.  If Braxton Hicks contractions are making you uncomfortable: ? Change your position from lying down or resting to walking, or change from walking to resting. ? Sit and rest in a tub of warm water. ? Drink enough fluid to keep your urine pale yellow. Dehydration may cause these contractions. ? Do slow and deep breathing several times an hour.  Keep all follow-up prenatal visits as told by your health care provider. This is important. Contact a health care provider if:  You have a fever.  You have continuous pain in your abdomen.  Get help right away if:  Your contractions become stronger, more regular, and closer together.  You have fluid leaking or gushing from your vagina.  You pass blood-tinged mucus (bloody show).  You have bleeding from your vagina.  You have low back pain that you never had before.  You feel your baby's head pushing down and causing pelvic pressure.  Your baby is not moving inside you as much as it used to. Summary  Contractions that occur before labor are called Braxton Hicks contractions, false labor, or practice contractions.  Braxton Hicks contractions are usually shorter, weaker, farther  apart, and less regular than true labor contractions. True labor contractions usually become progressively stronger and regular, and they become more frequent.  Manage discomfort from [facility] contractions by changing position, resting in a warm bath, drinking plenty of water, or practicing deep breathing. This information is not intended to replace advice given to you by your health care provider. Make sure you discuss any questions you have with your health care provider. Document Released: 09/16/2016 Document Revised: 04/15/2017 Document Reviewed: 09/16/2016 Elsevier Patient Education  2020 [employer]

## 2019-02-08 ENCOUNTER — Other Ambulatory Visit: Payer: Medicaid Other

## 2019-02-08 ENCOUNTER — Encounter: Payer: Medicaid Other | Admitting: Maternal Newborn

## 2019-02-19 ENCOUNTER — Ambulatory Visit (INDEPENDENT_AMBULATORY_CARE_PROVIDER_SITE_OTHER): Payer: Medicaid Other | Admitting: Maternal Newborn

## 2019-02-19 ENCOUNTER — Encounter: Payer: Self-pay | Admitting: Maternal Newborn

## 2019-02-19 ENCOUNTER — Other Ambulatory Visit (HOSPITAL_COMMUNITY)
Admission: RE | Admit: 2019-02-19 | Discharge: 2019-02-19 | Disposition: A | Discharge status: Home / Self Care | Payer: BC Managed Care – PPO | Source: Ambulatory Visit | Attending: Maternal Newborn | Admitting: Maternal Newborn

## 2019-02-19 ENCOUNTER — Ambulatory Visit (INDEPENDENT_AMBULATORY_CARE_PROVIDER_SITE_OTHER): Payer: BC Managed Care – PPO

## 2019-02-19 ENCOUNTER — Other Ambulatory Visit: Payer: Self-pay

## 2019-02-19 VITALS — BP 110/60 | Wt 238.0 lb

## 2019-02-19 DIAGNOSIS — Z3480 Encounter for supervision of other normal pregnancy, unspecified trimester: Secondary | ICD-10-CM

## 2019-02-19 DIAGNOSIS — O99891 Other specified diseases and conditions complicating pregnancy: Secondary | ICD-10-CM

## 2019-02-19 DIAGNOSIS — Z3689 Encounter for other specified antenatal screening: Secondary | ICD-10-CM | POA: Diagnosis not present

## 2019-02-19 DIAGNOSIS — B009 Herpesviral infection, unspecified: Secondary | ICD-10-CM

## 2019-02-19 DIAGNOSIS — Z3A38 38 weeks gestation of pregnancy: Secondary | ICD-10-CM

## 2019-02-19 LAB — POCT URINALYSIS DIPSTICK OB: Glucose, UA: NEGATIVE

## 2019-02-19 MED ORDER — VALACYCLOVIR HCL 500 MG PO TABS: 500.0000 mg | ORAL_TABLET | Freq: Two times a day (BID) | ORAL | 1 refills | Status: DC

## 2019-02-19 NOTE — Progress Notes (Signed)
No complaints. rj 

## 2019-02-19 NOTE — Progress Notes (Signed)
    Routine Prenatal Care Visit  Subjective  Meredith Riggs is a 33 y.o. [phone] at [redacted]w[redacted]d being seen today for ongoing prenatal care.  She is currently monitored for the following issues for this low-risk pregnancy and has Hidradenitis axillaris; Obesity (BMI 30-39.9); Supervision of other normal pregnancy, antepartum; Obesity affecting pregnancy, antepartum; History of cesarean section; History of VBAC; and Abdominal pain during pregnancy in third trimester on their problem list.  ----------------------------------------------------------------------------------- Patient reports ongoing back pain, irregular contractions. Contractions: Irregular. Vag. Bleeding: None.  Movement: Present. No leaking of fluid.  ----------------------------------------------------------------------------------- The following portions of the patient's history were reviewed and updated as appropriate: allergies, current medications, past family history, past medical history, past social history, past surgical history and problem list. Problem list updated.   Objective  Blood pressure 110/60, weight 238 lb (108 kg), last menstrual period 05/26/2018, unknown if currently breastfeeding. Pregravid weight 205 lb (93 kg) Total Weight Gain 33 lb (15 kg) Urinalysis: Urine dipstick shows negative for glucose, positive for protein (trace).  Fetal Status: Fetal Heart Rate (bpm): 153   Movement: Present  Presentation: Vertex  General:  Alert, oriented and cooperative. Patient is in no acute distress.  Skin: Skin is warm and dry. No rash noted.   Cardiovascular: Normal heart rate noted  Respiratory: Normal respiratory effort, no problems with respiration noted  Abdomen: Soft, gravid, appropriate for gestational age. Pain/Pressure: Present     Pelvic:  Cervical exam performed Dilation: Closed Effacement (%): 20 Station: -3  Extremities: Normal range of motion.  Edema: None  Mental Status: Normal mood and affect. Normal  behavior. Normal judgment and thought content.     Assessment   33 y.o. [id number] at [redacted]w[redacted]d, EDD 03/02/2019 by Last Menstrual Period presenting for a routine prenatal visit.  Plan   pregnancy #5 Problems (from 09/07/18 to present)    Problem Noted Resolved   History of cesarean section 10/26/2018 by Malachy Mood, MD No   History of VBAC 10/26/2018 by Malachy Mood, MD No   Obesity affecting pregnancy, antepartum 09/08/2018 by Rod Can, CNM No   Supervision of other normal pregnancy, antepartum 09/07/2018 by Rod Can, CNM No   Overview Addendum 01/05/2019  9:39 PM by Dalia Heading, [place] Prenatal Labs  Dating LMP = 17 week [place] Blood type:    O pos  Genetic Screen NIPS: negative XX Antibody: negative  Anatomic [place] Normal (echogenic focus) Rubella:Immune Varicella: Immune  GTT Early: 61  Third trimester:  RPR: NR  Rhogam N/A HBsAg: negative  TDaP vaccine   Flu Shot: HIV: negative  Baby Food Breast                             GBS:   Contraception  Pap: 09/07/18  CBB   Pelvis tested to 7lbs 10oz  CS/VBAC 2009 c/s, 2016 VBAC   Support Person Remo Lipps             Ultrasound today for presentation shows cephalic fetus with FHR 381 bpm. Results reviewed with patient.  Collected GBS and Aptima today.  Term labor symptoms and general obstetric precautions were reviewed.  Please refer to After Visit Summary for other counseling recommendations.   Return in about 1 week (around 02/26/2019) for ROB.  Avel Sensor, CNM 02/19/2019  10:25 AM

## 2019-02-19 NOTE — Patient Instructions (Signed)

## 2019-02-21 LAB — STREP GP B NAA: Strep Gp B NAA: NEGATIVE

## 2019-02-22 LAB — CERVICOVAGINAL ANCILLARY ONLY
Chlamydia: NEGATIVE
Neisseria Gonorrhea: NEGATIVE

## 2019-02-27 ENCOUNTER — Encounter: Payer: Self-pay | Admitting: Certified Nurse Midwife

## 2019-02-27 ENCOUNTER — Ambulatory Visit (INDEPENDENT_AMBULATORY_CARE_PROVIDER_SITE_OTHER): Payer: Medicaid Other | Admitting: Certified Nurse Midwife

## 2019-02-27 ENCOUNTER — Other Ambulatory Visit: Payer: Self-pay

## 2019-02-27 VITALS — BP 106/54 | Wt 236.0 lb

## 2019-02-27 DIAGNOSIS — Z3A39 39 weeks gestation of pregnancy: Secondary | ICD-10-CM

## 2019-02-27 DIAGNOSIS — Z98891 History of uterine scar from previous surgery: Secondary | ICD-10-CM

## 2019-02-27 DIAGNOSIS — O34219 Maternal care for unspecified type scar from previous cesarean delivery: Secondary | ICD-10-CM

## 2019-02-27 DIAGNOSIS — Z3480 Encounter for supervision of other normal pregnancy, unspecified trimester: Secondary | ICD-10-CM

## 2019-02-27 LAB — POCT URINALYSIS DIPSTICK OB
Glucose, UA: NEGATIVE
POC,PROTEIN,UA: NEGATIVE

## 2019-02-27 NOTE — Progress Notes (Signed)
C/o still having pelvic pain and really bad back pain.rj

## 2019-03-02 ENCOUNTER — Encounter: Payer: Self-pay | Admitting: Obstetrics & Gynecology

## 2019-03-02 ENCOUNTER — Ambulatory Visit (INDEPENDENT_AMBULATORY_CARE_PROVIDER_SITE_OTHER): Payer: Medicaid Other | Admitting: Obstetrics & Gynecology

## 2019-03-02 ENCOUNTER — Other Ambulatory Visit: Payer: Self-pay

## 2019-03-02 VITALS — BP 100/60 | Wt 238.0 lb

## 2019-03-02 DIAGNOSIS — Z98891 History of uterine scar from previous surgery: Secondary | ICD-10-CM

## 2019-03-02 DIAGNOSIS — B009 Herpesviral infection, unspecified: Secondary | ICD-10-CM

## 2019-03-02 DIAGNOSIS — O480 Post-term pregnancy: Secondary | ICD-10-CM

## 2019-03-02 DIAGNOSIS — O34219 Maternal care for unspecified type scar from previous cesarean delivery: Secondary | ICD-10-CM

## 2019-03-02 DIAGNOSIS — Z3A40 40 weeks gestation of pregnancy: Secondary | ICD-10-CM

## 2019-03-02 DIAGNOSIS — Z3480 Encounter for supervision of other normal pregnancy, unspecified trimester: Secondary | ICD-10-CM

## 2019-03-02 NOTE — Progress Notes (Signed)
HROB at 39wk4d: "I am done with this pregnancy." Tired and uncomfortable with irregular contractions and pelvic and back pain. Has not started taking her Valtrex PPX yet. Hx of CS in 2009 for fetal indications and had a successful VBAC in 2016 after a spontaneous labor at 39 weeks. Wants to deliver vaginally. Interested in induction of labor. Baby moving well. 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 Called in another RX for Valtrex 500 mgm BID-strongly encouraged patient to begin medicine. Dalia Heading, CNM   Addendum: 02/28/2019. Spoke with DR Georgianne Fick and Dr Kenton Kingfisher about plan of management. Due to increased risks of induction with unfavorable cervix, will give patient a little longer to see if cervix ripens. Ruthmary also offered repeat Cesarean section, but she declined. Will have Kamariyah come in on 10/16 for cervical exam with Dr Kenton Kingfisher and if cervix more ripe, he will consider IOL next week...possibly on 20 October (called L&D-this date will need special permission , after looking at staffing). Dalia Heading, CNM

## 2019-03-02 NOTE — Patient Instructions (Signed)

## 2019-03-02 NOTE — Progress Notes (Signed)
  [facility] INDUCTION ASSESSMENT SCHEDULING [name] 07-20-1985 Medical record #: [id number] Phone #:  Home Phone [phone]  Mobile [phone]    Prenatal Provider:[facility] Delivering Group[facility] Proposed admission date/time:03/06/19 at 0800 Method of induction:Pitocin  Weight: Filed Weights10/16/20 0933Weight:238 lb (108 kg) BMI Body mass index is 37.28 kg/m. HIV Negative HSV Negative EDC Estimated Date of Delivery: 10/16/20based on:US at [redacted] wks  Gestational age on admission: 15 Gravidity/parity:G5P2022  Cervix Score   0 1 2 3   Position Posterior Midposition Anterior   Consistency Firm Medium Soft   Effacement (%) 0-30 40-50 60-70 >80  Dilation (cm) Closed 1-2 3-4 >5  Baby's station -3 -2 -1 +1, +2   Bishop Score:6    select indication(s) below Elective induction ?39 weeks multiparous patient   Provider Signature: Hoyt Koch Scheduled FX:[id number] and [name] Date:03/02/2019 10:00 AM   Call [phone] to finalize the induction date/time  [id number] (07/17)

## 2019-03-02 NOTE — Progress Notes (Signed)
  Subjective  Fetal Movement? yes Contractions? yes Leaking Fluid? no Vaginal Bleeding? no  Objective  BP 100/60   Wt 238 lb (108 kg)   LMP 05/26/2018 (Within Days)   BMI 37.28 kg/m  General: NAD Pumonary: no increased work of breathing Abdomen: gravid, non-tender Extremities: no edema Psychiatric: mood appropriate, affect full Cx 2/90/-3  Assessment  33 y.o. D2K0254 at [redacted]w[redacted]d by  03/02/2019, by Last Menstrual Period presenting for routine prenatal visit  Plan   Problem List Items Addressed This Visit      Other   Supervision of other normal pregnancy, antepartum - Primary   History of cesarean section   History of VBAC    Other Visit Diagnoses    [redacted] weeks gestation of pregnancy       Herpes simplex infection          pregnancy #5 Problems (from 09/07/18 to present)    Problem Noted Resolved   History of cesarean section 10/26/2018 by Malachy Mood, MD No   History of VBAC 10/26/2018 by Malachy Mood, MD No   Obesity affecting pregnancy, antepartum 09/08/2018 by Rod Can, CNM No   Supervision of other normal pregnancy, antepartum 09/07/2018 by Rod Can, CNM No   Overview Addendum 01/05/2019  9:39 PM by Dalia Heading, [place] Prenatal Labs  Dating LMP = 17 week [place] Blood type:    O pos  Genetic Screen NIPS: negative XX Antibody: negative  Anatomic [place] Normal (echogenic focus) Rubella:Immune Varicella: Immune  GTT Early: 61  Third trimester:  RPR: NR  Rhogam N/A HBsAg: negative  TDaP vaccine   Flu Shot: HIV: negative  Baby Food Breast                             GBS:   Contraception  Pap: 09/07/18  CBB  No Pelvis tested to 7lbs 10oz  CS/VBAC 2009 c/s, 2016 VBAC   Support Person Meredith Riggs            IOL discussed  Membrane stripping today  Labor precautions  Barnett Applebaum, MD, [facility], [facility] 03/02/2019  9:59 AM

## 2019-03-02 NOTE — Progress Notes (Signed)
History and Physical  Meredith Riggs is a 33 y.o. [id number] [redacted]w[redacted]d  for Induction of Labor scheduled due to Favorable cervix at term .   See labor record for pregnancy highlights.  No recent pain, bleeding, ruptured membranes, or other signs of progressing labor.  PMHx: She  has a past medical history of Herpes genitalis and Vaginal Pap smear, abnormal. Also,  has a past surgical history that includes Cesarean section; Induced abortion; and Hydradenitis excision (Left, 10/15/2015)., family history includes Cancer in her maternal uncle.,  reports that she has never smoked. She has never used smokeless tobacco. She reports that she does not drink alcohol or use drugs. She has a current medication list which includes the following prescription(s): valacyclovir. Also, has No Known Allergies. OB History  Gravida Para Term Preterm AB Living  5 2 2  0 2 2  SAB TAB Ectopic Multiple Live Births  1 0 0 0 2    # Outcome Date GA Lbr Len/2nd Weight Sex Delivery Anes PTL Lv  5 Current           4 Term 05/08/15 [redacted]w[redacted]d 07:44 / 00:23 7 lb 9.7 oz (3.45 kg) F VBAC EPI  LIV  3 Term 12/12/07 [redacted]w[redacted]d  6 lb 11 oz (3.033 kg) M CS-LTranv   LIV     Complications: Fetal Intolerance  2 AB           1 SAB             Obstetric Comments  1st Menstrual Cycle:  16  1st Pregnancy: 21  Patient denies any other pertinent gynecologic issues.   Review of Systems  Constitutional: Negative for chills, fever and malaise/fatigue.  HENT: Negative for congestion, sinus pain and sore throat.   Eyes: Negative for blurred vision and pain.  Respiratory: Negative for cough and wheezing.   Cardiovascular: Negative for chest pain and leg swelling.  Gastrointestinal: Negative for abdominal pain, constipation, diarrhea, heartburn, nausea and vomiting.  Genitourinary: Negative for dysuria, frequency, hematuria and urgency.  Musculoskeletal: Negative for back pain, joint pain, myalgias and neck pain.  Skin: Negative for itching and rash.   Neurological: Negative for dizziness, tremors and weakness.  Endo/Heme/Allergies: Does not bruise/bleed easily.  Psychiatric/Behavioral: Negative for depression. The patient is not nervous/anxious and does not have insomnia.     Objective: BP 100/60   Wt 238 lb (108 kg)   LMP 05/26/2018 (Within Days)   BMI 37.28 kg/m  OBGyn Exam Physical examination Constitutional NAD, Conversant  Skin No rashes, lesions or ulceration. Normal palpated skin turgor. No nodularity.  Lungs: Clear to auscultation.No rales or wheezes. Normal Respiratory effort, no retractions.  Heart: NSR.  No murmurs or rubs appreciated. No periferal edema  Abdomen: Gravid.  Non-tender.  No masses.  No HSM. No hernia  Extremities: Moves all appropriately.  Normal ROM for age. No lymphadenopathy.  Neuro: Grossly intact  Psych: Oriented to PPT.  Normal mood. Normal affect.     Pelvic:   Vulva: Normal appearance.  No lesions.  Vagina: No lesions or abnormalities noted.  Urethra No masses tenderness or scarring.  Meatus Normal size without lesions or prolapse.  Cervix: 2/90/-3, soft, post BISHOP 6.   Perineum: Normal exam.  No lesions.        Bimanual   Uterus: Enlarged.  Non-tender.    Adnexae: Not palpated.  Cul-de-sac: Negative for abnormality.    Assessment: Term Pregnancy for Induction of Labor due to Favorable cervix at term.  Plan: Patient  will undergo induction of labor with pitocin.    She is also a VBAC (CS 11 years ago, VBAC in 2016 successful) She desires trial of labor after cesarean section (TOLAC) versus elective repeat cesarean delivery (ERCD). The following risks were discussed with the patient.  Risk of uterine rupture at term is 0.78 percent with TOLAC and 0.22 percent with ERCD. 1 in 10 uterine ruptures will result in neonatal death or neurological injury. The benefits of a trial of labor after cesarean (TOLAC) resulting in a vaginal birth after cesarean (VBAC) include the following: shorter  length of hospital stay and postpartum recovery (in most cases); fewer complications, such as postpartum fever, wound or uterine infection, thromboembolism (blood clots in the leg or lung), need for blood transfusion and fewer neonatal breathing problems.  The risks of an attempted VBAC or TOLAC include the following: Risk of failed trial of labor after cesarean (TOLAC) without a vaginal birth after cesarean (VBAC) resulting in repeat cesarean delivery (RCD) in about 20 to 42 percent of women who attempt VBAC.  Risk of rupture of uterus resulting in an emergency cesarean delivery. The risk of uterine rupture may be related in part to the type of uterine incision made during the first cesarean delivery. A previous transverse uterine incision has the lowest risk of rupture (0.2 to 1.5 percent risk). Vertical or T-shaped uterine incisions have a higher risk of uterine rupture (4 to 9 percent risk)The risk of fetal death is very low with both VBAC and elective repeat cesarean delivery (ERCD), but the likelihood of fetal death is higher with VBAC than with ERCD. Maternal death is very rare with either type of delivery.  The risks of an elective repeat cesarean delivery (ERCD) were reviewed with the patient including but not limited to: 06/998 risk of uterine rupture which could have serious consequences, bleeding which may require transfusion; infection which may require antibiotics; injury to bowel, bladder or other surrounding organs (bowel, bladder, ureters); injury to the fetus; need for additional procedures including hysterectomy in the event of a life-threatening hemorrhage; thromboembolic phenomenon; abnormal placentation; incisional problems; death and other postoperative or anesthesia complications.     Patient has been fully informed of the pros and cons, risks and benefits of continued observation with fetal monitoring versus that of induction of labor.   She understands that there are uncommon risks  to induction, which include but are not limited to : frequent or prolonged uterine contractions, fetal distress, uterine rupture, and lack of successful induction.  These risks include all methods including Pitocin and Misoprostol and Cervadil.  Patient understands that using Misoprostol for labor induction is an "off label" indication although it has been studied extensively for this purpose and is an accepted method of induction.  She also has been informed of the increased risks for Cesarean with induction and should induction not be successful.  Patient consents to the induction plan of management.  Plans to breast feed Plans no method for contraception TDaP UTD  Barnett Applebaum, MD, [facility], [facility] 03/02/2019  12:12 PM

## 2019-03-06 ENCOUNTER — Other Ambulatory Visit: Payer: Self-pay

## 2019-03-06 ENCOUNTER — Inpatient Hospital Stay
Admission: RE | Admit: 2019-03-06 | Discharge: 2019-03-08 | DRG: 807 | Disposition: A | Discharge status: Home / Self Care | Payer: BC Managed Care – PPO | Attending: Obstetrics & Gynecology | Admitting: Obstetrics & Gynecology

## 2019-03-06 DIAGNOSIS — Z20828 Contact with and (suspected) exposure to other viral communicable diseases: Secondary | ICD-10-CM | POA: Diagnosis present

## 2019-03-06 DIAGNOSIS — O99210 Obesity complicating pregnancy, unspecified trimester: Secondary | ICD-10-CM

## 2019-03-06 DIAGNOSIS — D649 Anemia, unspecified: Secondary | ICD-10-CM | POA: Diagnosis present

## 2019-03-06 DIAGNOSIS — O34219 Maternal care for unspecified type scar from previous cesarean delivery: Secondary | ICD-10-CM | POA: Diagnosis present

## 2019-03-06 DIAGNOSIS — Z3A40 40 weeks gestation of pregnancy: Secondary | ICD-10-CM

## 2019-03-06 DIAGNOSIS — O480 Post-term pregnancy: Principal | ICD-10-CM | POA: Diagnosis present

## 2019-03-06 DIAGNOSIS — Z98891 History of uterine scar from previous surgery: Secondary | ICD-10-CM

## 2019-03-06 DIAGNOSIS — O9902 Anemia complicating childbirth: Secondary | ICD-10-CM | POA: Diagnosis present

## 2019-03-06 DIAGNOSIS — Z3480 Encounter for supervision of other normal pregnancy, unspecified trimester: Secondary | ICD-10-CM

## 2019-03-06 LAB — CBC
HCT: 33.2 % — ABNORMAL LOW (ref 36.0–46.0)
Hemoglobin: 10.8 g/dL — ABNORMAL LOW (ref 12.0–15.0)
MCH: 29.7 pg (ref 26.0–34.0)
MCHC: 32.5 g/dL (ref 30.0–36.0)
MCV: 91.2 fL (ref 80.0–100.0)
Platelets: 144 10*3/uL — ABNORMAL LOW (ref 150–400)
RBC: 3.64 MIL/uL — ABNORMAL LOW (ref 3.87–5.11)
RDW: 13.8 % (ref 11.5–15.5)
WBC: 11.4 10*3/uL — ABNORMAL HIGH (ref 4.0–10.5)
nRBC: 0 % (ref 0.0–0.2)

## 2019-03-06 LAB — TYPE AND SCREEN
ABO/RH(D): O POS
Antibody Screen: NEGATIVE

## 2019-03-06 LAB — SARS CORONAVIRUS 2 BY RT PCR (HOSPITAL ORDER, PERFORMED IN ~~LOC~~ HOSPITAL LAB): SARS Coronavirus 2: NEGATIVE

## 2019-03-06 MED ORDER — IBUPROFEN 600 MG PO TABS
600.0000 mg | ORAL_TABLET | Freq: Four times a day (QID) | ORAL | Status: DC
  Administered 2019-03-06: 600 mg via ORAL

## 2019-03-06 MED ORDER — OXYTOCIN 10 UNIT/ML IJ SOLN
INTRAMUSCULAR | Status: AC
  Filled 2019-03-06: qty 2

## 2019-03-06 MED ORDER — AMMONIA AROMATIC IN INHA
RESPIRATORY_TRACT | Status: AC
  Filled 2019-03-06: qty 10

## 2019-03-06 MED ORDER — OXYTOCIN 40 UNITS IN NORMAL SALINE INFUSION - SIMPLE MED
1.0000 m[IU]/min | INTRAVENOUS | Status: DC
  Administered 2019-03-06: 15:00:00 1 m[IU]/min via INTRAVENOUS
  Filled 2019-03-06: qty 1000

## 2019-03-06 MED ORDER — LACTATED RINGERS IV SOLN
INTRAVENOUS | Status: DC
  Administered 2019-03-06: 15:00:00 via INTRAVENOUS

## 2019-03-06 MED ORDER — ACETAMINOPHEN 325 MG PO TABS: 650.0000 mg | ORAL_TABLET | ORAL | Status: DC | PRN

## 2019-03-06 MED ORDER — ONDANSETRON HCL 4 MG/2ML IJ SOLN: 4.0000 mg | Freq: Four times a day (QID) | INTRAMUSCULAR | Status: DC | PRN

## 2019-03-06 MED ORDER — LIDOCAINE HCL (PF) 1 % IJ SOLN
INTRAMUSCULAR | Status: AC
  Filled 2019-03-06: qty 30

## 2019-03-06 MED ORDER — TERBUTALINE SULFATE 1 MG/ML IJ SOLN: 0.2500 mg | Freq: Once | INTRAMUSCULAR | Status: DC | PRN

## 2019-03-06 MED ORDER — IBUPROFEN 600 MG PO TABS
ORAL_TABLET | ORAL | Status: AC
  Filled 2019-03-06: qty 1

## 2019-03-06 MED ORDER — LACTATED RINGERS IV SOLN: 500.0000 mL | INTRAVENOUS | Status: DC | PRN

## 2019-03-06 MED ORDER — BUTORPHANOL TARTRATE 1 MG/ML IJ SOLN
1.0000 mg | INTRAMUSCULAR | Status: DC | PRN
  Administered 2019-03-06: 1 mg via INTRAVENOUS
  Filled 2019-03-06: qty 1

## 2019-03-06 MED ORDER — LIDOCAINE HCL (PF) 1 % IJ SOLN: 30.0000 mL | INTRAMUSCULAR | Status: DC | PRN

## 2019-03-06 MED ORDER — MISOPROSTOL 200 MCG PO TABS
ORAL_TABLET | ORAL | Status: AC
  Filled 2019-03-06: qty 4

## 2019-03-06 MED ORDER — OXYTOCIN 40 UNITS IN NORMAL SALINE INFUSION - SIMPLE MED
2.5000 [IU]/h | INTRAVENOUS | Status: DC
  Administered 2019-03-06: 2.5 [IU]/h via INTRAVENOUS
  Filled 2019-03-06: qty 1000

## 2019-03-06 MED ORDER — OXYTOCIN BOLUS FROM INFUSION
500.0000 mL | Freq: Once | INTRAVENOUS | Status: AC
  Administered 2019-03-06: 500 mL via INTRAVENOUS

## 2019-03-06 NOTE — H&P (Signed)
History and Physical Interval Note:  03/06/2019 11:32 AM  Meredith Riggs  has presented today for INDUCTION OF LABOR (pitocin),  with the diagnosis of Favorable cervix at term. She is also a VBAC w prior CS 11 years ago and prior VBAC 4 years ago. The various methods of treatment have been discussed with the patient and family. After consideration of risks, benefits and other options for treatment, the patient has consented to  Labor induction .  The patient's history has been reviewed, patient examined, no change in status, and is stable for induction as planned.  See H&P. I have reviewed the patient's chart and labs.  Questions were answered to the patient's satisfaction.    Barnett Applebaum, MD, [facility], [facility] 03/06/2019  11:32 AM

## 2019-03-06 NOTE — Progress Notes (Signed)
OR RN notified we have started pitocin and are initiating the TOLAC protocol. RN states she will tell [facility].

## 2019-03-06 NOTE — Discharge Instructions (Signed)

## 2019-03-06 NOTE — Progress Notes (Signed)
Progressing well Anticipate second stage soon  One dose Stadol given for pain SROM Pitocin Cervix changing each check FHT 120s, accels, earlies  IOL, Post Dates, VBAC  Barnett Applebaum, MD, [place], [facility] 03/06/2019  6:56 PM

## 2019-03-06 NOTE — Discharge Summary (Signed)
OB Discharge Summary     Patient Name: Meredith Riggs DOB: 05/22/1985 MRN: [id number]  Date of admission: 03/06/2019 Delivering MD: Letitia Libra, MD  Date of Delivery: 03/06/2019  Date of discharge: 03/08/2019  Admitting diagnosis: Post Dates, Prior Cesarean Section Intrauterine pregnancy: [redacted]w[redacted]d     Secondary diagnosis: None     Discharge diagnosis: Term Pregnancy Delivered and VBAC, No other diagnosis                        Hospital course:  Induction of Labor With Vaginal Delivery   33 y.o. yo [id number] at [redacted]w[redacted]d was admitted to the hospital 03/06/2019 for induction of labor.  Indication for induction: Postdates.  Patient had an uncomplicated labor course as follows: Membrane Rupture Time/Date: 5:50 PM ,03/06/2019   Intrapartum Procedures: Episiotomy: None [1]                                         Lacerations:  None [1]  Patient had delivery of a Viable infant.  Information for the patient's newborn:  Ahmyah, Gidley [[id number]]  Delivery Method: Vag-Spont    03/06/2019  Details of delivery can be found in separate delivery note.  Patient had a routine postpartum course. Patient is discharged home 03/08/19.                                                                 Post partum procedures: none  Complications: None  Physical exam on 03/08/2019: Vitals:   03/07/19 1526 03/07/19 1530 03/08/19 0018 03/08/19 0856  BP: 118/73 110/70 112/80 126/88  Pulse: 66 70 64 66  Resp: 18 18 20 18   Temp: 98.8 F (37.1 C) 98 F (36.7 C) 98.1 F (36.7 C) 98.9 F (37.2 C)  TempSrc: Oral Oral Oral Oral  SpO2: 99% 98%  100%  Weight:      Height:       General: alert and cooperative Lochia: appropriate Uterine Fundus: firm, having strong afterpains Incision: N/A DVT Evaluation: No evidence of DVT seen on physical exam.  Labs: Lab Results  Component Value Date   WBC 17.1 (H) 03/07/2019   HGB 10.4 (L) 03/07/2019   HCT 31.5 (L) 03/07/2019   MCV 90.0 03/07/2019   PLT 146 (L) 03/07/2019   CMP Latest Ref Rng & Units 10/07/2015  Glucose 65 - 99 mg/dL 80  BUN 6 - 20 mg/dL 12  Creatinine 10/09/2015 - 4.25 mg/dL 9.56  Sodium 3.87 - 564 mmol/L 138  Potassium 3.5 - 5.1 mmol/L 3.5  Chloride 101 - 111 mmol/L 105  CO2 22 - 32 mmol/L 27  Calcium 8.9 - 10.3 mg/dL 9.1  Total Protein 6.4 - 8.2 g/dL -  Total Bilirubin 0.2 - 1.0 mg/dL -  Alkaline Phos 50 - 332 Unit/L -  AST 15 - 37 Unit/L -  ALT 12 - 78 U/L -    Discharge instruction: per After Visit Summary.  Medications:  Allergies as of 03/08/2019   No Known Allergies     Medication List    STOP taking these medications   valACYclovir 500 MG tablet Commonly known as: VALTREX  Diet: routine diet  Activity: Advance as tolerated. Pelvic rest for 6 weeks.   Outpatient follow up: Follow-up Information    Gae Dry, MD. Schedule an appointment as soon as possible for a visit in 6 day(s).   Specialty: Obstetrics and Gynecology Contact information: [address] [place] [address] [phone]             Postpartum contraception: Lactational Amenorrhea Rhogam Given postpartum: no Rubella vaccine given postpartum: no Varicella vaccine given postpartum: no TDaP given antepartum or postpartum: No  Newborn Data: Live born female  Birth Weight: 7 lb, 0.5 oz  APGAR: 9, 9  Newborn Delivery   Birth date/time: 03/06/2019 20:21:00 Delivery type: Vaginal, Spontaneous      Baby Feeding: Breast  Disposition: home with mother  SIGNED: Rexene Agent, CNM 03/08/2019 9:42 AM

## 2019-03-07 LAB — RPR: RPR Ser Ql: NONREACTIVE

## 2019-03-07 LAB — CBC
HCT: 31.5 % — ABNORMAL LOW (ref 36.0–46.0)
Hemoglobin: 10.4 g/dL — ABNORMAL LOW (ref 12.0–15.0)
MCH: 29.7 pg (ref 26.0–34.0)
MCHC: 33 g/dL (ref 30.0–36.0)
MCV: 90 fL (ref 80.0–100.0)
Platelets: 146 10*3/uL — ABNORMAL LOW (ref 150–400)
RBC: 3.5 MIL/uL — ABNORMAL LOW (ref 3.87–5.11)
RDW: 13.9 % (ref 11.5–15.5)
WBC: 17.1 10*3/uL — ABNORMAL HIGH (ref 4.0–10.5)
nRBC: 0 % (ref 0.0–0.2)

## 2019-03-07 MED ORDER — OXYCODONE-ACETAMINOPHEN 5-325 MG PO TABS
1.0000 | ORAL_TABLET | ORAL | Status: DC | PRN
  Administered 2019-03-08: 1 via ORAL
  Filled 2019-03-07: qty 1

## 2019-03-07 MED ORDER — ONDANSETRON HCL 4 MG PO TABS: 4.0000 mg | ORAL_TABLET | ORAL | Status: DC | PRN

## 2019-03-07 MED ORDER — DIBUCAINE (PERIANAL) 1 % EX OINT: 1.0000 "application " | TOPICAL_OINTMENT | CUTANEOUS | Status: DC | PRN

## 2019-03-07 MED ORDER — BENZOCAINE-MENTHOL 20-0.5 % EX AERO
1.0000 "application " | INHALATION_SPRAY | CUTANEOUS | Status: DC | PRN
  Administered 2019-03-07: 1 via TOPICAL
  Filled 2019-03-07: qty 56

## 2019-03-07 MED ORDER — ACETAMINOPHEN 325 MG PO TABS
650.0000 mg | ORAL_TABLET | ORAL | Status: DC | PRN
  Administered 2019-03-07 (×3): 650 mg via ORAL
  Filled 2019-03-07 (×4): qty 2

## 2019-03-07 MED ORDER — ONDANSETRON HCL 4 MG/2ML IJ SOLN: 4.0000 mg | INTRAMUSCULAR | Status: DC | PRN

## 2019-03-07 MED ORDER — SENNOSIDES-DOCUSATE SODIUM 8.6-50 MG PO TABS
2.0000 | ORAL_TABLET | ORAL | Status: DC
  Administered 2019-03-07 – 2019-03-08 (×2): 2 via ORAL
  Filled 2019-03-07 (×2): qty 2

## 2019-03-07 MED ORDER — ZOLPIDEM TARTRATE 5 MG PO TABS: 5.0000 mg | ORAL_TABLET | Freq: Every evening | ORAL | Status: DC | PRN

## 2019-03-07 MED ORDER — SODIUM CHLORIDE 0.9% FLUSH: 3.0000 mL | INTRAVENOUS | Status: DC | PRN

## 2019-03-07 MED ORDER — COCONUT OIL OIL
1.0000 "application " | TOPICAL_OIL | Status: DC | PRN
  Administered 2019-03-07: 1 via TOPICAL
  Filled 2019-03-07: qty 120

## 2019-03-07 MED ORDER — WITCH HAZEL-GLYCERIN EX PADS: 1.0000 "application " | MEDICATED_PAD | CUTANEOUS | Status: DC | PRN

## 2019-03-07 MED ORDER — SODIUM CHLORIDE 0.9% FLUSH: 3.0000 mL | Freq: Two times a day (BID) | INTRAVENOUS | Status: DC

## 2019-03-07 MED ORDER — IBUPROFEN 600 MG PO TABS
600.0000 mg | ORAL_TABLET | Freq: Four times a day (QID) | ORAL | Status: DC
  Administered 2019-03-07 – 2019-03-08 (×5): 600 mg via ORAL
  Filled 2019-03-07 (×6): qty 1

## 2019-03-07 MED ORDER — PRENATAL MULTIVITAMIN CH
1.0000 | ORAL_TABLET | Freq: Every day | ORAL | Status: DC
  Administered 2019-03-07 – 2019-03-08 (×2): 1 via ORAL
  Filled 2019-03-07 (×2): qty 1

## 2019-03-07 MED ORDER — DIPHENHYDRAMINE HCL 25 MG PO CAPS: 25.0000 mg | ORAL_CAPSULE | Freq: Four times a day (QID) | ORAL | Status: DC | PRN

## 2019-03-07 MED ORDER — SODIUM CHLORIDE 0.9 % IV SOLN: 250.0000 mL | INTRAVENOUS | Status: DC | PRN

## 2019-03-07 MED ORDER — SIMETHICONE 80 MG PO CHEW: 80.0000 mg | CHEWABLE_TABLET | ORAL | Status: DC | PRN

## 2019-03-07 MED ORDER — OXYCODONE-ACETAMINOPHEN 5-325 MG PO TABS
2.0000 | ORAL_TABLET | ORAL | Status: DC | PRN
  Administered 2019-03-07: 04:00:00 2 via ORAL
  Filled 2019-03-07: qty 2

## 2019-03-07 NOTE — Lactation Note (Signed)
This note was copied from a baby's chart. Lactation Consultation Note  Patient Name: Meredith Riggs [id number] Date: 03/07/2019 Reason for consult: Initial assessment  LC introduced to mom and baby.  Baby has been breastfeeding well per mom, and latch score of 9 given earlier. Baby appearing relaxed and content when Meredith Riggs was in the room.  Mom has history of breastfeeding, 1 month with her 31 yr old, and 6 months with her 31 year old. She reports difficulty with low milk supply each time.  Reviewed tips/strategies for keeping infant alert at the breast, use of breast massage and compression to encourage full feedings to help encourage adequate milk supply. Baby has wet/stool diapers above expectations for age. Reviewed breastfeeding basics: newborn stomach size, typical feeding patterns, early hunger cues, signs of fullness/contentment, and importance of skin to skin.  Mom plans to breastfeed as long as possible, hoping for a higher milk supply than with other children.  LC name/number written on whiteboard, encouraged to call out when infant begins to cue for observation, assistance, and evaluation with feed. Mom agrees.   Maternal Data Formula Feeding for Exclusion: No Does the patient have breastfeeding experience prior to this delivery?: Yes  Feeding    LATCH Score                   Interventions Interventions: Breast feeding basics reviewed  Lactation Tools Discussed/Used     Consult Status Consult Status: Follow-up Date: 03/07/19 Follow-up type: In-patient    Meredith Riggs 03/07/2019, 2:08 PM

## 2019-03-07 NOTE — Progress Notes (Signed)
Post Partum Day 1: VBAC Subjective: up ad lib, voiding and tolerating PO. Cramping, relieved with analgesics. Bleeding "like a heavy menses". Breast feeding  Objective: Blood pressure 106/62, pulse 61, temperature 98.5 F (36.9 C), temperature source Oral, resp. rate 20, height 5\' 7"  (1.702 m), weight 108 kg, last menstrual period 05/26/2018, SpO2 96 %, unknown if currently breastfeeding.  Physical Exam:  General: alert, cooperative and no distress Lochia: appropriate Uterine Fundus: firm at U/ML/NT DVT Evaluation: No evidence of DVT seen on physical exam.  Recent Labs    03/06/19 1228 03/07/19 0626  HGB 10.8* 10.4*  HCT 33.2* 31.5*  WBC 11.4* 17.1*  PLT 144* 146*    Assessment/Plan: Stable PPD #1-continue postpartum care/ monitor bleeding Chronic anemia: vitamins and iron Breast feeding O POS/ RI/VI Contraception: LAM TDAP: last dose 05/09/2015   LOS: 1 day   Dalia Heading 03/07/2019, 2:23 PM

## 2019-03-08 ENCOUNTER — Ambulatory Visit: Payer: Self-pay

## 2019-03-08 NOTE — Progress Notes (Signed)
Patient discharged home with infant. Discharge instructions reviewed with patient. Patient verbalized understanding. Escorted out by staff. 

## 2019-03-08 NOTE — Lactation Note (Signed)
This note was copied from a baby's chart. Lactation Consultation Note  Patient Name: Meredith Riggs Date: 03/08/2019   Mom concerned that Meredith Riggs has been cluster feeding through the night and that she might not have enough milk to satisfy her.  Explained supply and demand and that this was normal on second night and 3rd day for her to be more awake, interested and cluster feeding to bring in mature milk and ensure a plentiful milk supply.  Demonstrated hand expression of colostrum again.  Assisted mom with comfortable position with pillow support for this breast feed.  Sanei latched with minimal assistance and began strong rhythmic sucking with occasional swallows which were pointed out to mom.  Mom reports with other babies, she struggled with milk supply.  Discussed signs to know she was getting enough milk at the breast.  Information given and reviewed on how to increase and maintain milk supply.  Reviewed newborn stomach size, feeding cues, normal course of lactation and routine newborn feeding patterns.  Mom and baby to be discharged shortly.  Lactation community resources given with contact numbers and encouraged to call with any questions, concerns or assistance.   Maternal Data    Feeding    LATCH Score                   Interventions    Lactation Tools Discussed/Used     Consult Status      Jarold Motto 03/08/2019, 7:55 PM

## 2019-04-02 ENCOUNTER — Other Ambulatory Visit: Payer: Self-pay | Admitting: Obstetrics & Gynecology

## 2019-04-02 ENCOUNTER — Telehealth: Payer: Self-pay

## 2019-04-02 NOTE — Telephone Encounter (Signed)
Pt wanting to return to work sooner than 6 week PP. I advised pt that legally we would need to see her to make sure she is okay to go back to work before writing a note to return. She is requesting to be seen sooner than 6 weeks, please advise since im not sure you have any sooner openings.

## 2019-04-02 NOTE — Telephone Encounter (Signed)
Note done, it is OK to do this and keep appt for Dec2

## 2019-04-02 NOTE — Telephone Encounter (Signed)
Pt aware.

## 2019-04-09 ENCOUNTER — Telehealth: Payer: Self-pay | Admitting: Obstetrics & Gynecology

## 2019-04-09 NOTE — Telephone Encounter (Signed)
Patient called today requesting return to work note faxed to [phone]. Faxed completed at 12 pm 04/09/19

## 2019-04-15 IMAGING — DX DG ANKLE COMPLETE 3+V*R*
3 series · 3 of 3 positions shown · non-contrast
Comparison: None.

CLINICAL DATA: Pain after twisting injury.

EXAM:
RIGHT ANKLE - COMPLETE 3+ VIEW

[ankle ap]
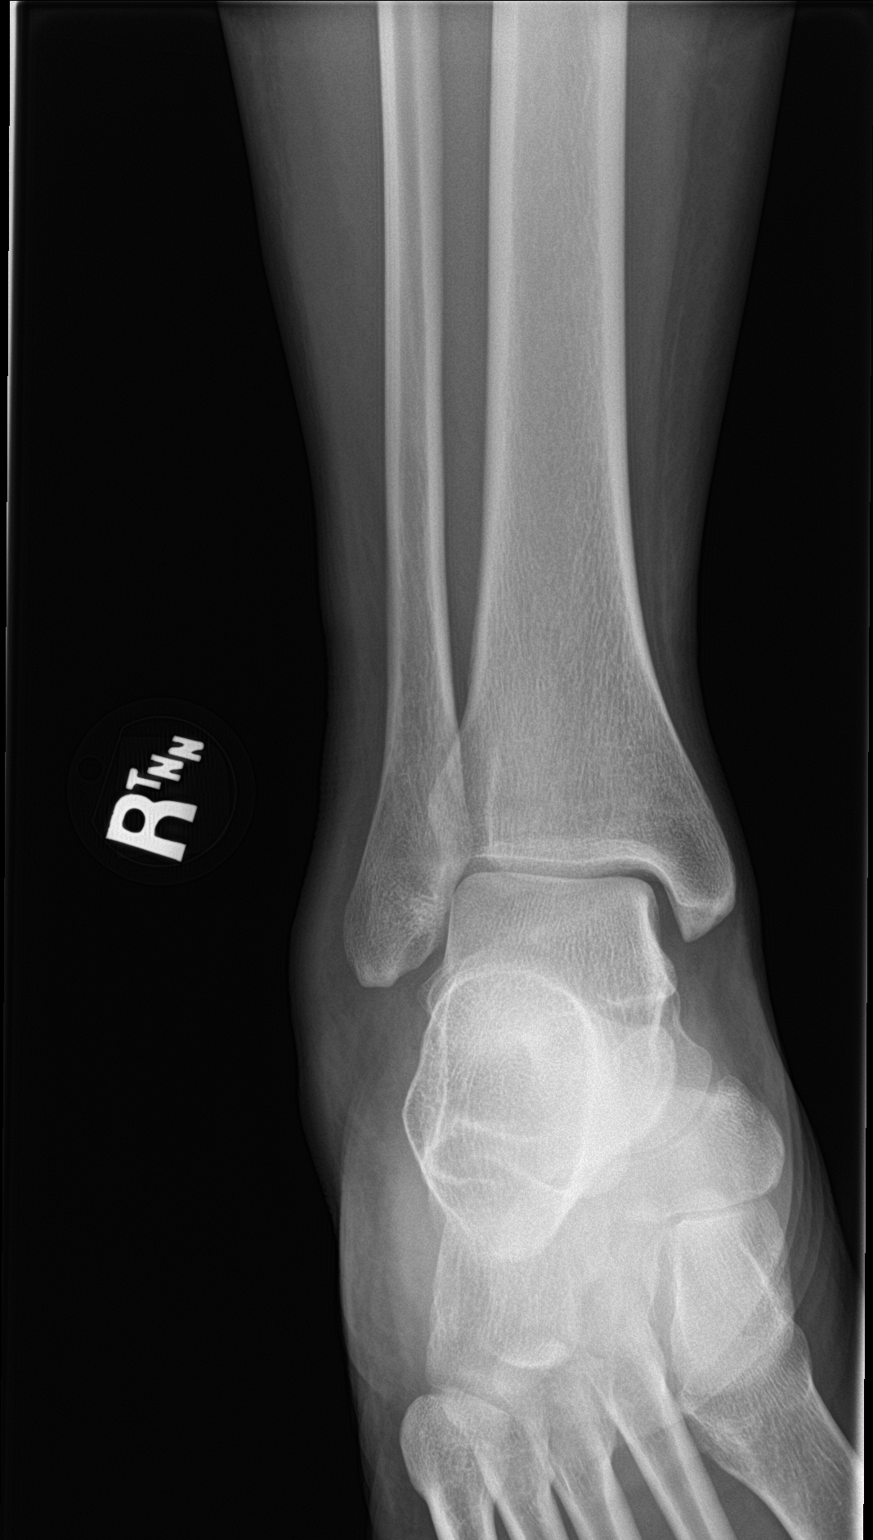

[ankle obl]
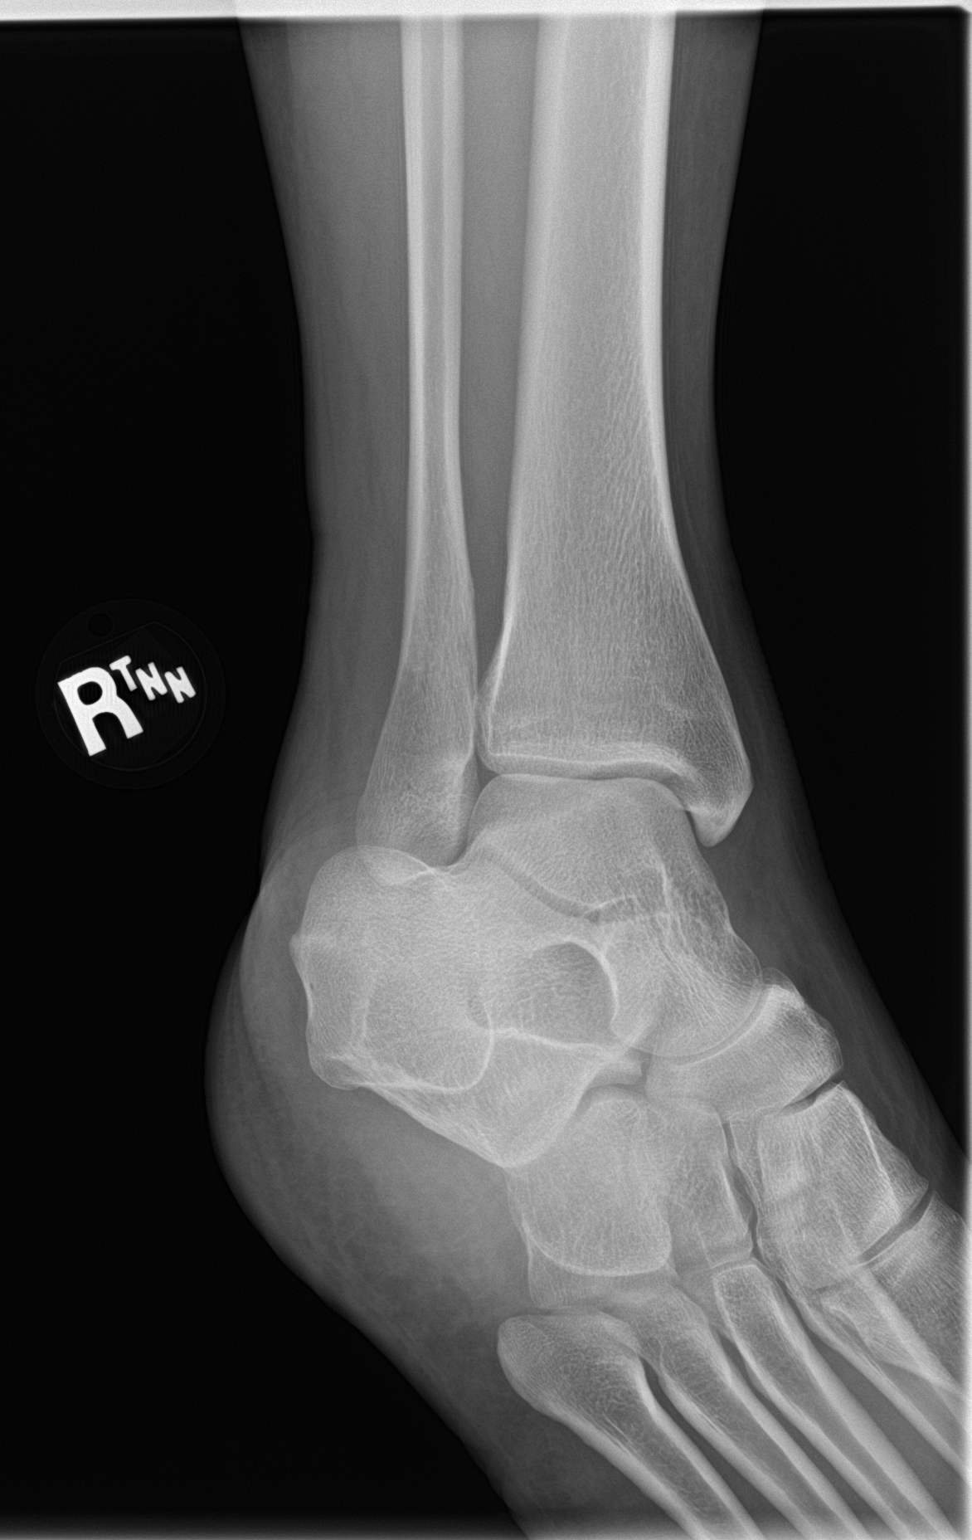

[ankle lat]
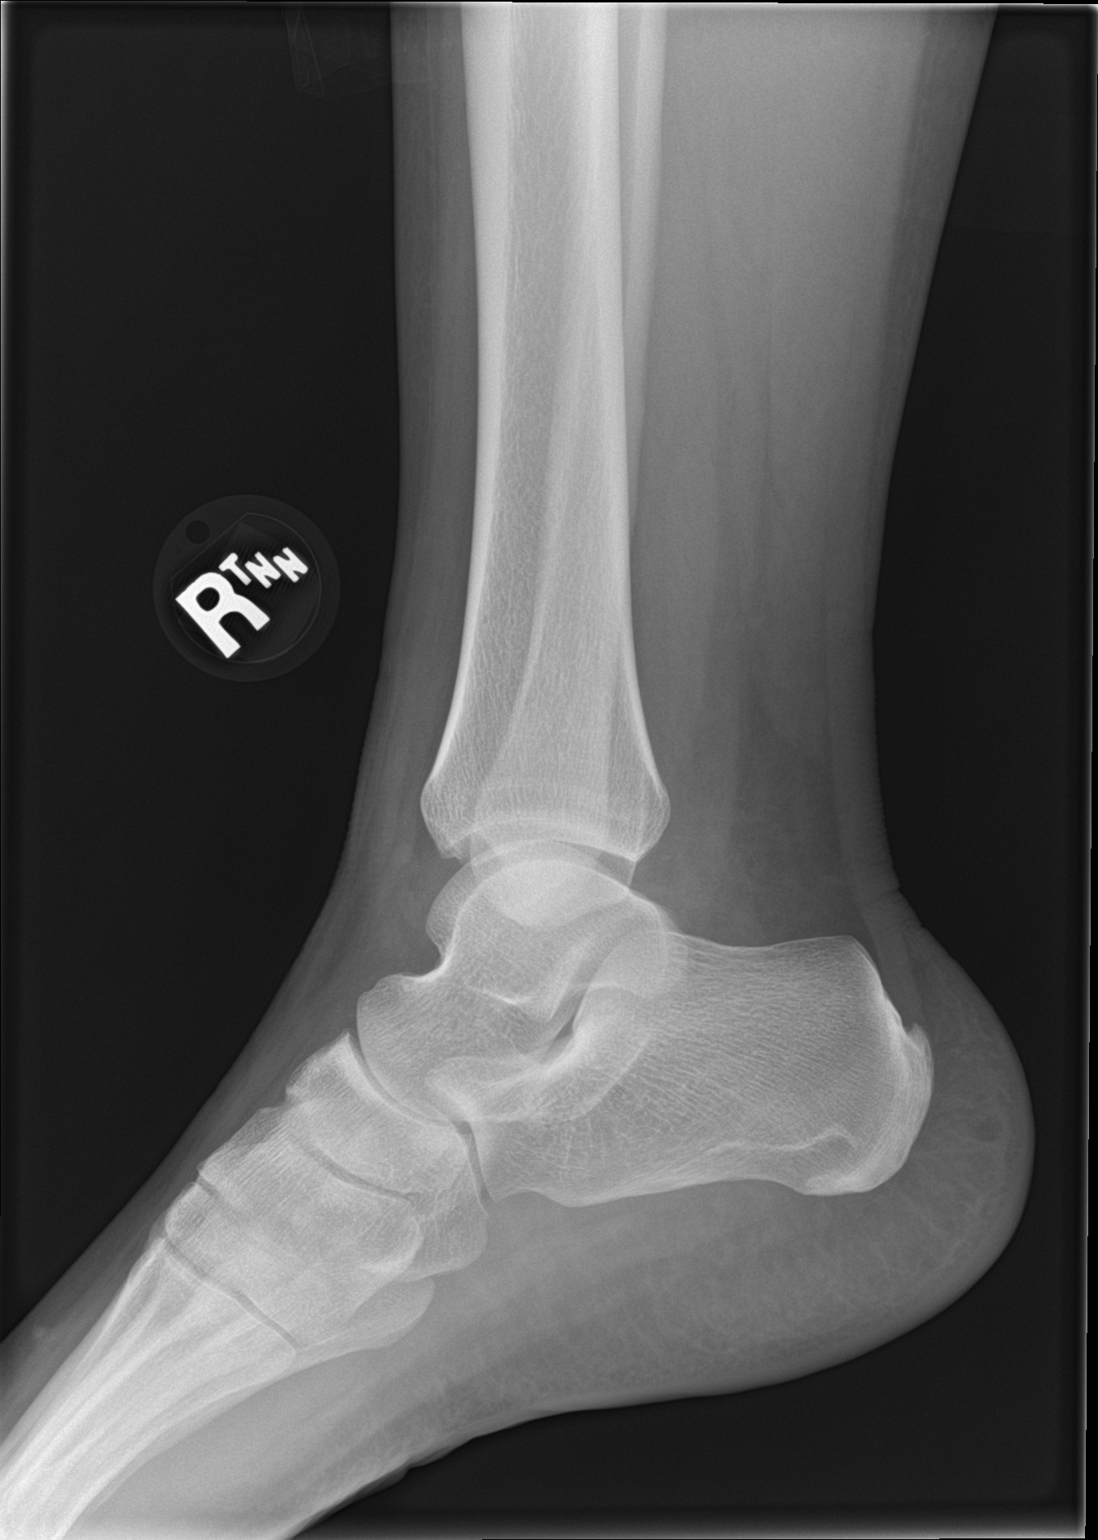

[3 of 3 positions shown; findings below may reference images not displayed]

FINDINGS: Lateral soft tissue swelling.  No fractures.
IMPRESSION: Soft tissue swelling laterally.  No fractures.

## 2019-04-18 ENCOUNTER — Ambulatory Visit: Payer: Medicaid Other | Admitting: Obstetrics & Gynecology

## 2019-05-16 IMAGING — CR DG CHEST 2V
1 series · 2 of 2 positions shown · non-contrast
Comparison: None.

CLINICAL DATA: Body aches and cough.

EXAM:
CHEST - 2 VIEW

[Series 1: dg chest 2 view · 0.14mm/px · 2 of 2 slices shown]
[im 1/2]
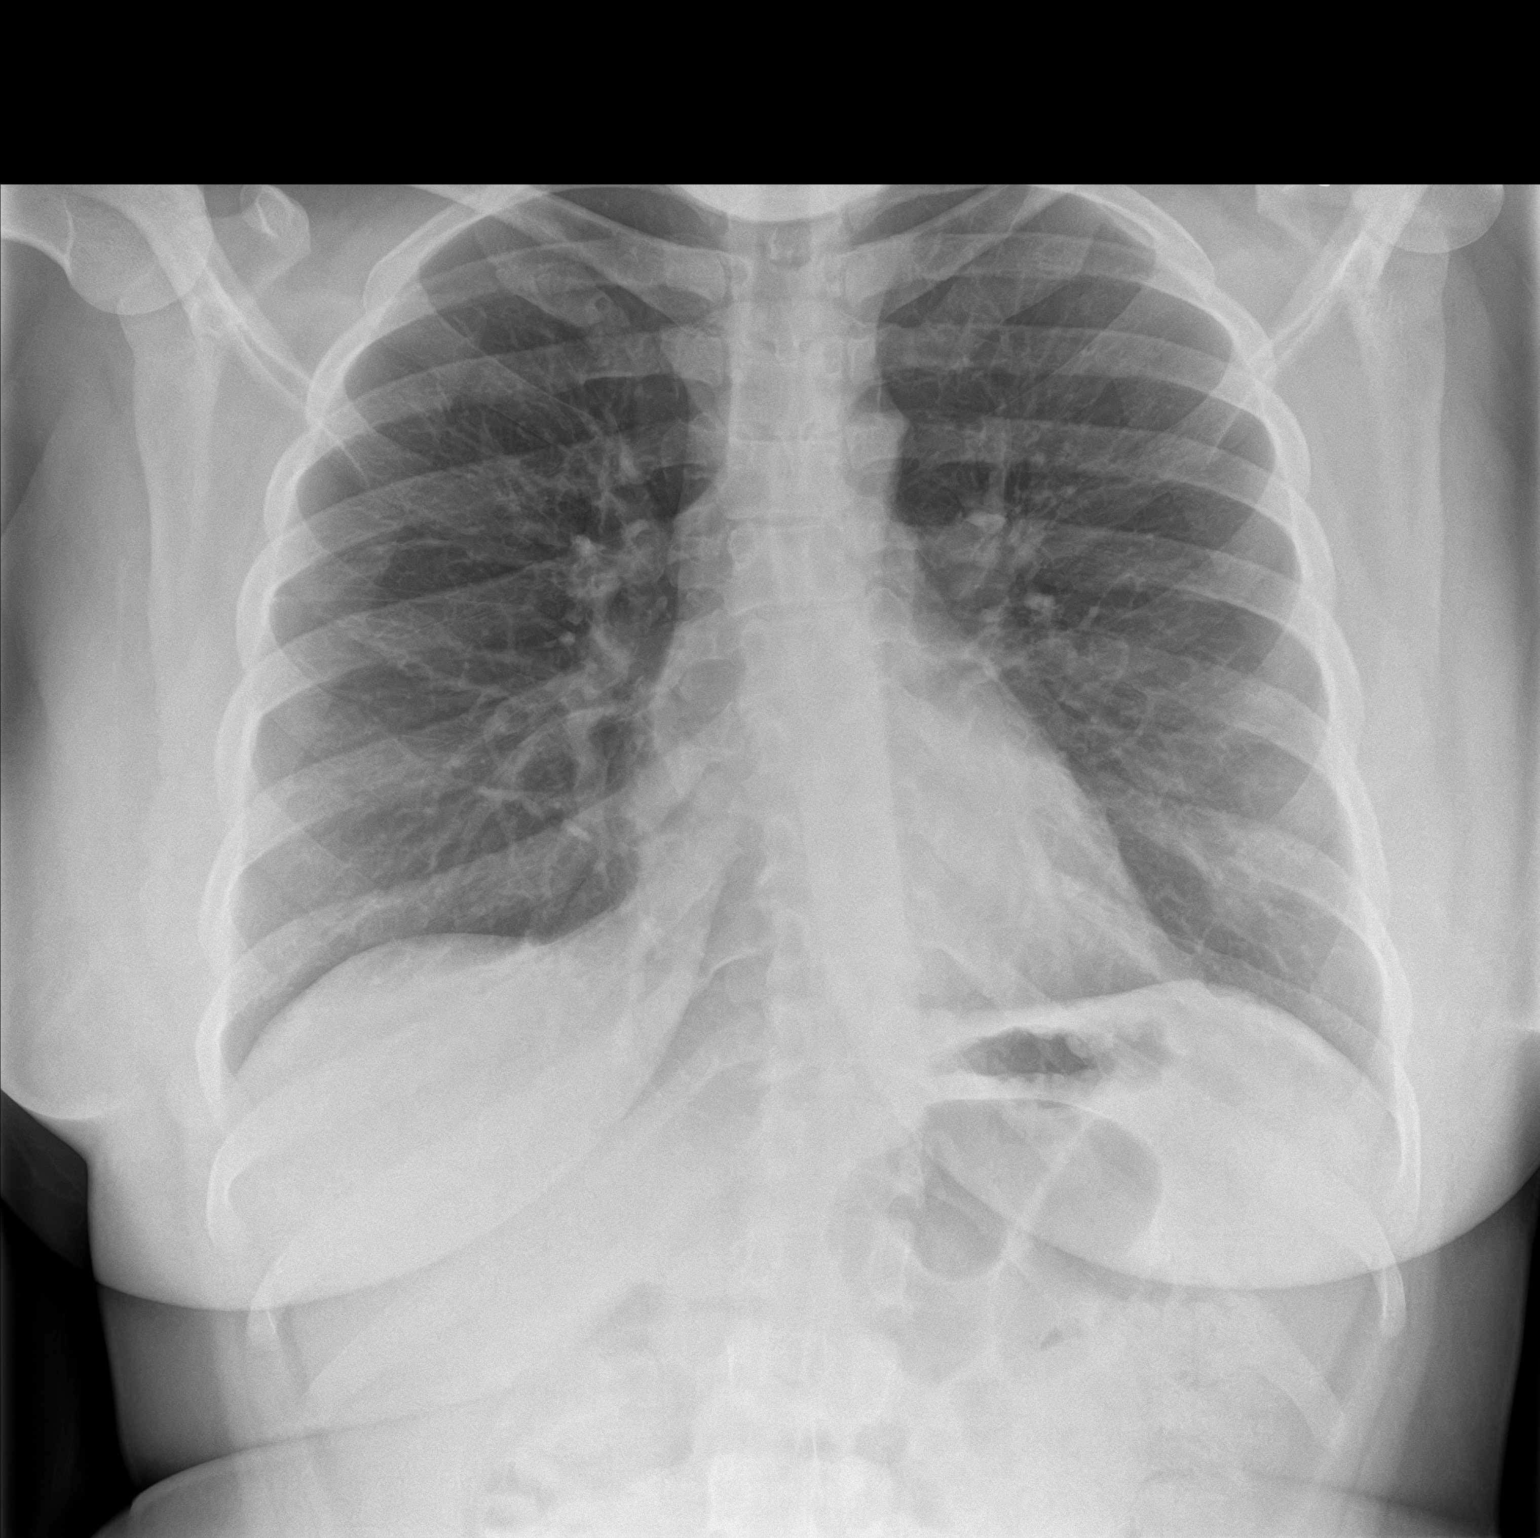
[im 2/2]
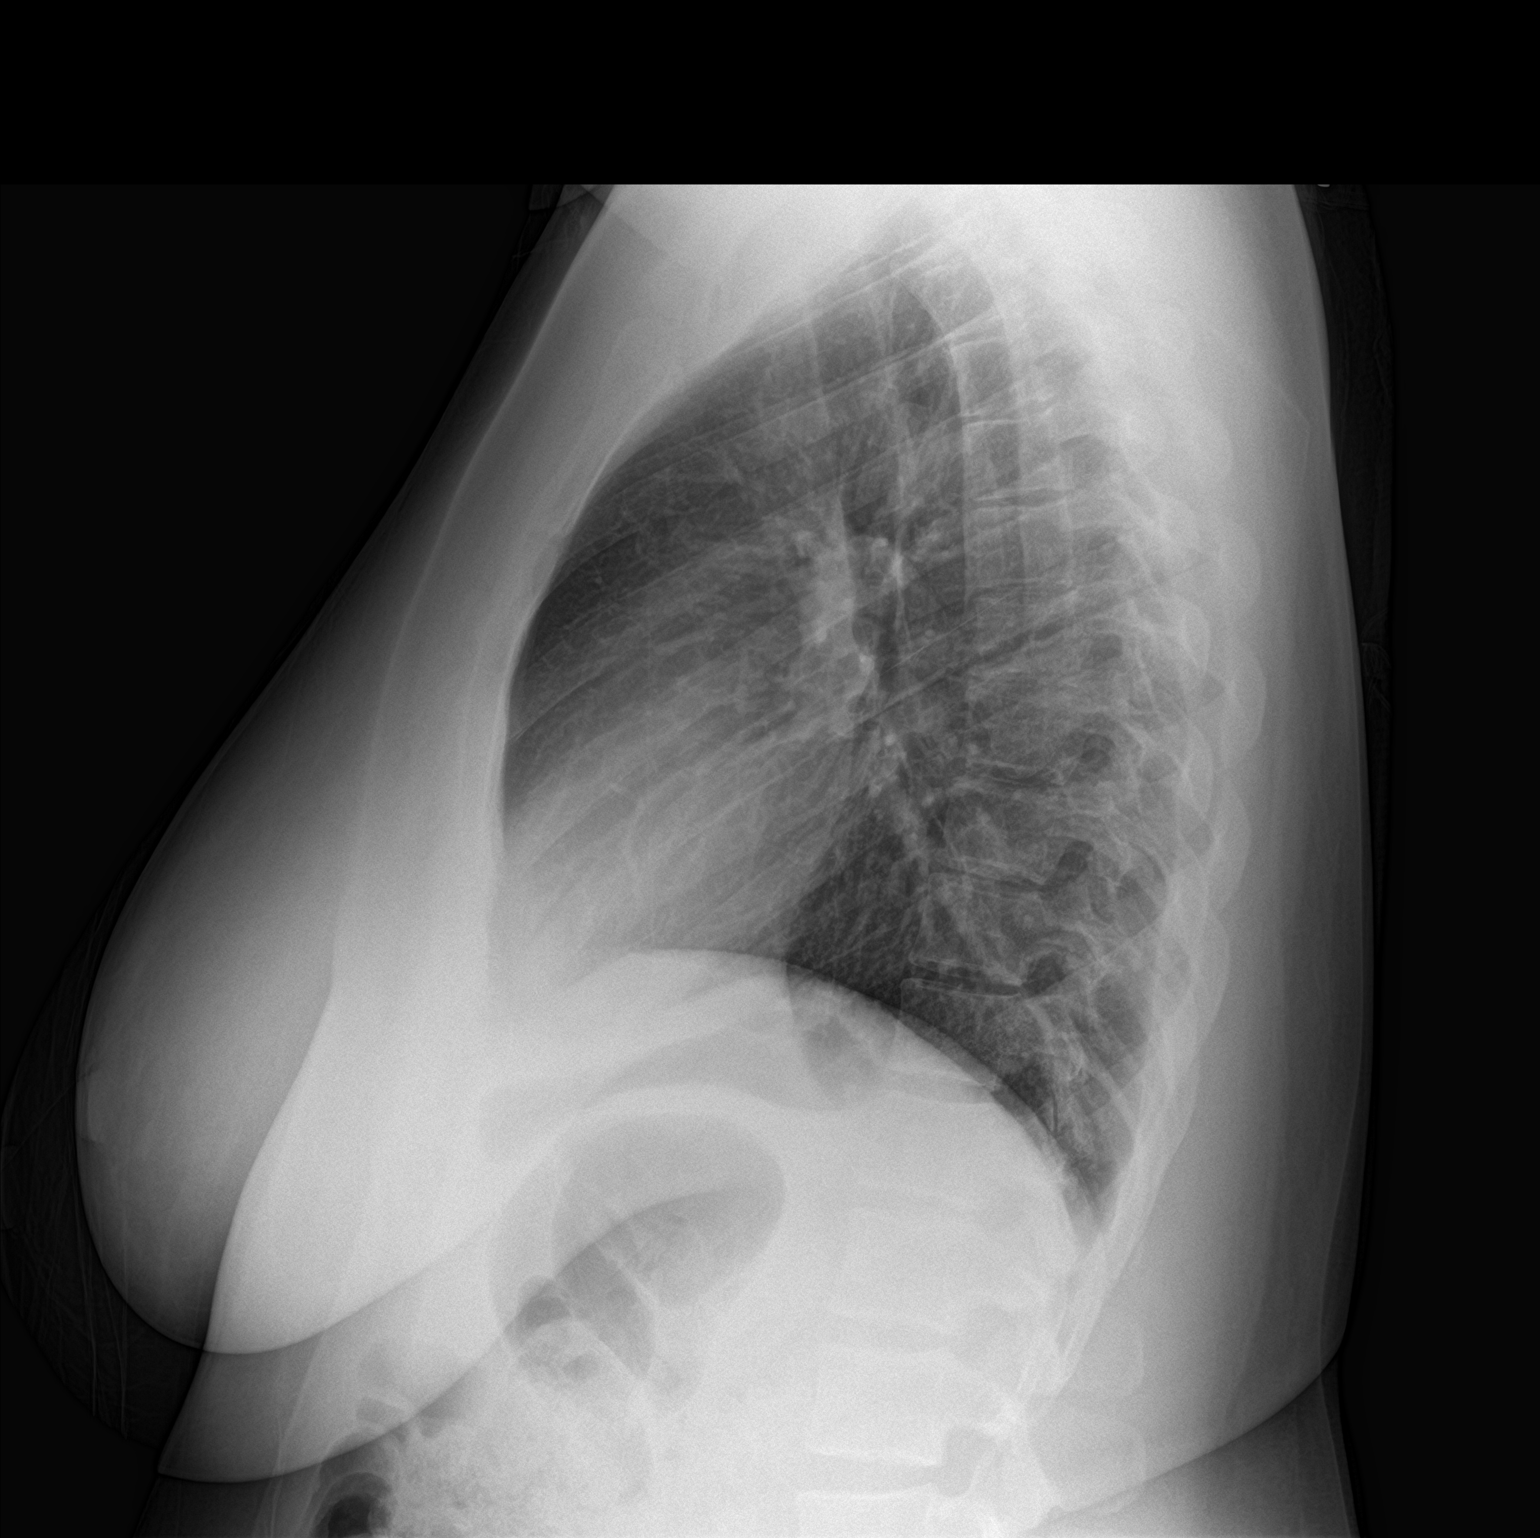

[2 of 2 positions shown; findings below may reference images not displayed]

FINDINGS: The heart size and mediastinal contours are within normal limits.
Both lungs are clear. The visualized skeletal structures are
unremarkable.
IMPRESSION: No active cardiopulmonary disease.

## 2019-09-21 ENCOUNTER — Ambulatory Visit: Payer: Medicaid Other | Attending: Internal Medicine

## 2019-09-21 DIAGNOSIS — Z20822 Contact with and (suspected) exposure to covid-19: Secondary | ICD-10-CM

## 2019-09-22 LAB — NOVEL CORONAVIRUS, NAA: SARS-CoV-2, NAA: NOT DETECTED

## 2019-09-22 LAB — SARS-COV-2, NAA 2 DAY TAT

## 2019-11-09 ENCOUNTER — Ambulatory Visit (INDEPENDENT_AMBULATORY_CARE_PROVIDER_SITE_OTHER): Payer: Medicaid Other | Admitting: Advanced Practice Midwife

## 2019-11-09 ENCOUNTER — Other Ambulatory Visit (HOSPITAL_COMMUNITY)
Admission: RE | Admit: 2019-11-09 | Discharge: 2019-11-09 | Disposition: A | Discharge status: Home / Self Care | Payer: Medicaid Other | Source: Ambulatory Visit | Attending: Advanced Practice Midwife | Admitting: Advanced Practice Midwife

## 2019-11-09 ENCOUNTER — Other Ambulatory Visit: Payer: Self-pay

## 2019-11-09 ENCOUNTER — Encounter: Payer: Self-pay | Admitting: Advanced Practice Midwife

## 2019-11-09 VITALS — BP 122/74 | Wt 222.0 lb

## 2019-11-09 DIAGNOSIS — Z6834 Body mass index (BMI) 34.0-34.9, adult: Secondary | ICD-10-CM | POA: Diagnosis not present

## 2019-11-09 DIAGNOSIS — N898 Other specified noninflammatory disorders of vagina: Secondary | ICD-10-CM | POA: Diagnosis not present

## 2019-11-09 DIAGNOSIS — E669 Obesity, unspecified: Secondary | ICD-10-CM

## 2019-11-09 MED ORDER — PHENTERMINE HCL 37.5 MG PO TABS: 37.5000 mg | ORAL_TABLET | Freq: Every day | ORAL | 0 refills | Status: DC

## 2019-11-09 NOTE — Progress Notes (Signed)
Patient ID: Meredith Riggs, female   DOB: June 30, 1985, 34 y.o.   MRN: [id number]  Reason for Consult: Vaginitis    Subjective:  Date of Service: 11/09/2019  HPI:   Meredith Riggs is a 34 y.o. female is being seen for complaint of vaginal odor and discharge which she has noticed for approximately 1 week. She denies itching or irritation. She denies urinary symptoms. She also mentions at the end of the visit that she would like to restart phentermine that she previously took 2 years ago. She understands that I will write a 1 month prescription and she will need to follow up with one of the MDs for monthly weight checks prior to further refills.   Past Medical History:  Diagnosis Date   Herpes genitalis    Vaginal Pap smear, abnormal    Pt. don't know when the last one was abn.    Family History  Problem Relation Age of Onset   Cancer Maternal Uncle        oral   Diabetes Neg Hx    Heart disease Neg Hx    Hypertension Neg Hx    Past Surgical History:  Procedure Laterality Date   CESAREAN SECTION     HYDRADENITIS EXCISION Left 10/15/2015   Procedure: EXCISION HIDRADENITIS AXILLA;  Surgeon: Lattie Haw, MD;  Location: [facility] ORS;  Service: General;  Laterality: Left;   INDUCED ABORTION      Short Social History:  Social History   Tobacco Use   Smoking status: Never Smoker   Smokeless tobacco: Never Used  Substance Use Topics   Alcohol use: No    No Known Allergies  Current Outpatient Medications  Medication Sig Dispense Refill   phentermine (ADIPEX-P) 37.5 MG tablet Take 1 tablet (37.5 mg total) by mouth daily before breakfast. 30 tablet 0   No current facility-administered medications for this visit.    Review of Systems  Constitutional: Negative for chills and fever.  HENT: Negative for congestion, ear discharge, ear pain, hearing loss, sinus pain and sore throat.   Eyes: Negative for blurred vision and double vision.  Respiratory: Negative  for cough, shortness of breath and wheezing.   Cardiovascular: Negative for chest pain, palpitations and leg swelling.  Gastrointestinal: Negative for abdominal pain, blood in stool, constipation, diarrhea, heartburn, melena, nausea and vomiting.  Genitourinary: Negative for dysuria, flank pain, frequency, hematuria and urgency.       Positive for vaginal odor and discharge  Musculoskeletal: Negative for back pain, joint pain and myalgias.  Skin: Negative for itching and rash.  Neurological: Negative for dizziness, tingling, tremors, sensory change, speech change, focal weakness, seizures, loss of consciousness, weakness and headaches.  Endo/Heme/Allergies: Negative for environmental allergies. Does not bruise/bleed easily.  Psychiatric/Behavioral: Negative for depression, hallucinations, memory loss, substance abuse and suicidal ideas. The patient is not nervous/anxious and does not have insomnia.         Objective:  Objective   Vitals:   11/09/19 1626  BP: 122/74  Weight: 222 lb (100.7 kg)   Body mass index is 34.77 kg/m. Constitutional: obese, well developed female in no acute distress.  HEENT: normal Skin: Warm and dry.  Cardiovascular: Regular rate and rhythm.   Extremity: no edema  Respiratory: Clear to auscultation bilateral. Normal respiratory effort Neuro: DTRs 2+, Cranial nerves grossly intact Psych: Alert and Oriented x3. No memory deficits. Normal mood and affect.  MS: normal gait, normal bilateral lower extremity ROM/strength/stability.  Pelvic exam:  is not  limited by body habitus EGBUS: within normal limits Vagina: within normal limits and with normal mucosa, scant thin white discharge Cervix: not evaluated  Update to visit: Aptima is normal- no evidence of BV or yeast   Assessment/Plan:     34 y.o. G5 P70 female with possible BV and request for medication to assist with weight loss  Vaginitis Aptima Follow up as needed after lab results, MyChart  message sent regarding normal lab Boric Acid suppositories for prevention of vaginitis 1 month Rx Phentermine Follow up with MD for weight check and further refills   [place] 11/14/2019, 4:35 PM

## 2019-11-13 LAB — CERVICOVAGINAL ANCILLARY ONLY
Bacterial Vaginitis (gardnerella): NEGATIVE
Candida Glabrata: NEGATIVE
Candida Vaginitis: NEGATIVE
Comment: NEGATIVE
Comment: NEGATIVE
Comment: NEGATIVE

## 2020-04-28 ENCOUNTER — Ambulatory Visit (INDEPENDENT_AMBULATORY_CARE_PROVIDER_SITE_OTHER): Payer: Medicaid Other | Admitting: Advanced Practice Midwife

## 2020-04-28 ENCOUNTER — Other Ambulatory Visit: Payer: Self-pay

## 2020-04-28 ENCOUNTER — Other Ambulatory Visit (HOSPITAL_COMMUNITY)
Admission: RE | Admit: 2020-04-28 | Discharge: 2020-04-28 | Disposition: A | Discharge status: Home / Self Care | Payer: Medicaid Other | Source: Ambulatory Visit | Attending: Advanced Practice Midwife | Admitting: Advanced Practice Midwife

## 2020-04-28 ENCOUNTER — Encounter: Payer: Self-pay | Admitting: Advanced Practice Midwife

## 2020-04-28 VITALS — BP 110/70 | Ht 67.0 in | Wt 219.8 lb

## 2020-04-28 DIAGNOSIS — N898 Other specified noninflammatory disorders of vagina: Secondary | ICD-10-CM

## 2020-04-28 MED ORDER — METRONIDAZOLE 0.75 % VA GEL: 1.0000 | Freq: Every day | VAGINAL | 1 refills | Status: AC

## 2020-04-28 MED ORDER — FLUCONAZOLE 150 MG PO TABS: 150.0000 mg | ORAL_TABLET | Freq: Once | ORAL | 1 refills | Status: AC

## 2020-04-28 NOTE — Progress Notes (Signed)
Patient ID: Meredith Riggs, female   DOB: 11/24/1985, 34 y.o.   MRN: [id number]  Reason for Visit: Gynecologic Exam (Yeast infection )   Subjective:  HPI:  Meredith Riggs is a 34 y.o. female being seen for symptoms of vaginitis. She thinks she has a yeast infection. Her symptoms began 1 day ago and are vaginal irritation described as a dry feeling, thick clear discharge and fishy vaginal odor. She has these symptoms when she uses soap around her vagina. She denies itching or burning. She has no other concerns at this time.  Past Medical History:  Diagnosis Date  . Herpes genitalis   . Vaginal Pap smear, abnormal    Pt. don't know when the last one was abn.    Family History  Problem Relation Age of Onset  . Cancer Maternal Uncle        oral  . Diabetes Neg Hx   . Heart disease Neg Hx   . Hypertension Neg Hx    Past Surgical History:  Procedure Laterality Date  . CESAREAN SECTION    . HYDRADENITIS EXCISION Left 10/15/2015   Procedure: EXCISION HIDRADENITIS AXILLA;  Surgeon: Lattie Haw, MD;  Location: [facility] ORS;  Service: General;  Laterality: Left;  . INDUCED ABORTION      Short Social History:  Social History   Tobacco Use  . Smoking status: Never Smoker  . Smokeless tobacco: Never Used  Substance Use Topics  . Alcohol use: No    No Known Allergies  Current Outpatient Medications  Medication Sig Dispense Refill  . phentermine (ADIPEX-P) 37.5 MG tablet Take 1 tablet (37.5 mg total) by mouth daily before breakfast. 30 tablet 0  . fluconazole (DIFLUCAN) 150 MG tablet Take 1 tablet (150 mg total) by mouth once for 1 dose. Can take additional dose three days later if symptoms persist 2 tablet 1  . metroNIDAZOLE (METROGEL) 0.75 % vaginal gel Place 1 Applicatorful vaginally at bedtime for 5 days. 70 g 1   No current facility-administered medications for this visit.    Review of Systems  Constitutional: Negative for chills and fever.  HENT: Negative for  congestion, ear discharge, ear pain, hearing loss, sinus pain and sore throat.   Eyes: Negative for blurred vision and double vision.  Respiratory: Negative for cough, shortness of breath and wheezing.   Cardiovascular: Negative for chest pain, palpitations and leg swelling.  Gastrointestinal: Negative for abdominal pain, blood in stool, constipation, diarrhea, heartburn, melena, nausea and vomiting.  Genitourinary: Negative for dysuria, flank pain, frequency, hematuria and urgency.       Positive for vaginal irritation, discharge and odor  Musculoskeletal: Negative for back pain, joint pain and myalgias.  Skin: Negative for itching and rash.  Neurological: Negative for dizziness, tingling, tremors, sensory change, speech change, focal weakness, seizures, loss of consciousness, weakness and headaches.  Endo/Heme/Allergies: Negative for environmental allergies. Does not bruise/bleed easily.  Psychiatric/Behavioral: Negative for depression, hallucinations, memory loss, substance abuse and suicidal ideas. The patient is not nervous/anxious and does not have insomnia.         Objective:  Objective   Vitals:   04/28/20 1400  BP: 110/70  Weight: 219 lb 12.8 oz (99.7 kg)  Height: 5\' 7"  (1.702 m)   Body mass index is 34.43 kg/m. Constitutional: Well nourished, well developed female in no acute distress.  HEENT: normal Skin: Warm and dry.  Respiratory: Normal respiratory effort Extremity: no edema Neuro: DTRs 2+, Cranial nerves grossly intact Psych: Alert  and Oriented x3. No memory deficits. Normal mood and affect.  MS: normal gait, normal bilateral lower extremity ROM/strength/stability.  Pelvic exam:  is not limited by body habitus EGBUS: within normal limits Vagina: within normal limits and with normal mucosa, scant thin white discharge Cervix: not evaluated  Data: Wet Prep: positive for a few clue cells, negative for yeast  Assessment/Plan:     34 y.o. G5 P69 female with  likely BV, will treat empirically   Aptima vaginitis lab Rx Metro gel Rx Diflucan to use as needed if yeast develops Follow up as needed after lab results   Tresea Mall CNM [facility] 04/28/2020, 4:25 PM

## 2020-04-28 NOTE — Patient Instructions (Signed)

## 2020-04-28 NOTE — Progress Notes (Signed)
yeast infection x 1 day

## 2020-04-30 ENCOUNTER — Other Ambulatory Visit: Payer: Self-pay | Admitting: Advanced Practice Midwife

## 2020-04-30 DIAGNOSIS — B9689 Other specified bacterial agents as the cause of diseases classified elsewhere: Secondary | ICD-10-CM

## 2020-04-30 LAB — CERVICOVAGINAL ANCILLARY ONLY
Bacterial Vaginitis (gardnerella): POSITIVE — AB
Candida Glabrata: NEGATIVE
Candida Vaginitis: NEGATIVE
Comment: NEGATIVE
Comment: NEGATIVE
Comment: NEGATIVE

## 2020-04-30 MED ORDER — METRONIDAZOLE 500 MG PO TABS: 500.0000 mg | ORAL_TABLET | Freq: Two times a day (BID) | ORAL | 0 refills | Status: AC

## 2020-04-30 NOTE — Progress Notes (Signed)
Rx metronidazole pills sent per patient request. She prefers pills to gel.

## 2021-07-11 ENCOUNTER — Encounter: Payer: Self-pay | Admitting: Emergency Medicine

## 2021-07-11 ENCOUNTER — Emergency Department
Admission: EM | Admit: 2021-07-11 | Discharge: 2021-07-11 | Disposition: A | Discharge status: Home / Self Care | Payer: Medicaid Other | Attending: Emergency Medicine | Admitting: Emergency Medicine

## 2021-07-11 ENCOUNTER — Other Ambulatory Visit: Payer: Self-pay

## 2021-07-11 DIAGNOSIS — J029 Acute pharyngitis, unspecified: Secondary | ICD-10-CM | POA: Diagnosis present

## 2021-07-11 DIAGNOSIS — J020 Streptococcal pharyngitis: Secondary | ICD-10-CM | POA: Diagnosis not present

## 2021-07-11 DIAGNOSIS — Z20822 Contact with and (suspected) exposure to covid-19: Secondary | ICD-10-CM | POA: Insufficient documentation

## 2021-07-11 LAB — RESP PANEL BY RT-PCR (FLU A&B, COVID) ARPGX2
Influenza A by PCR: NEGATIVE
Influenza B by PCR: NEGATIVE
SARS Coronavirus 2 by RT PCR: NEGATIVE

## 2021-07-11 LAB — GROUP A STREP BY PCR: Group A Strep by PCR: DETECTED — AB

## 2021-07-11 MED ORDER — FLUCONAZOLE 150 MG PO TABS: ORAL_TABLET | ORAL | 0 refills | Status: DC

## 2021-07-11 MED ORDER — ACETAMINOPHEN 325 MG PO TABS
650.0000 mg | ORAL_TABLET | Freq: Once | ORAL | Status: AC
  Administered 2021-07-11: 650 mg via ORAL
  Filled 2021-07-11: qty 2

## 2021-07-11 MED ORDER — AMOXICILLIN 875 MG PO TABS: 875.0000 mg | ORAL_TABLET | Freq: Two times a day (BID) | ORAL | 0 refills | Status: DC

## 2021-07-11 NOTE — Discharge Instructions (Signed)
Elect regular doctor if not improved in 3 days.  Return emergency department worsening.  Gargle with warm salt water.  Take antibiotic as prescribed

## 2021-07-11 NOTE — ED Triage Notes (Signed)
Pt via POV from home. Pt c/o headache, body aches, sore throat, fever since Friday. Pt is A&OX4 and NAD.

## 2021-07-11 NOTE — ED Provider Notes (Signed)
[facility] Provider Note    Event Date/Time   First MD Initiated Contact with Patient 07/11/21 1705     (approximate)   History   URI   HPI  Meredith Riggs is a 36 y.o. female presents emergency department flulike symptoms.  Sore throat.  Fever.  States achy all over.  No UTI symptoms.  No vomiting or diarrhea      Physical Exam   Triage Vital Signs: ED Triage Vitals  Enc Vitals Group     BP 07/11/21 1617 138/85     Pulse Rate 07/11/21 1617 (!) 116     Resp 07/11/21 1617 20     Temp 07/11/21 1617 (!) 101.3 F (38.5 C)     Temp Source 07/11/21 1617 Oral     SpO2 07/11/21 1617 98 %     Weight 07/11/21 1615 220 lb (99.8 kg)     Height 07/11/21 1615 5\' 8"  (1.727 m)     Head Circumference --      Peak Flow --      Pain Score 07/11/21 1615 10     Pain Loc --      Pain Edu? --      Excl. in GC? --     Most recent vital signs: Vitals:   07/11/21 1617  BP: 138/85  Pulse: (!) 116  Resp: 20  Temp: (!) 101.3 F (38.5 C)  SpO2: 98%     General: Awake, no distress.   CV:  Good peripheral perfusion. regular rate and  rhythm Resp:  Normal effort. Lungs CTA Abd:  No distention.   Other:  Throat is minimally red   ED Results / Procedures / Treatments   Labs (all labs ordered are listed, but only abnormal results are displayed) Labs Reviewed  GROUP A STREP BY PCR - Abnormal; Notable for the following components:      Result Value   Group A Strep by PCR DETECTED (*)    All other components within normal limits  RESP PANEL BY RT-PCR (FLU A&B, COVID) [id number]     EKG     RADIOLOGY     PROCEDURES:   Procedures   MEDICATIONS ORDERED IN ED: Medications  acetaminophen (TYLENOL) tablet 650 mg (650 mg Oral Given 07/11/21 1620)     IMPRESSION / MDM / ASSESSMENT AND PLAN / ED COURSE  I reviewed the triage vital signs and the nursing notes.                              Differential diagnosis includes, but is not limited  to, strep throat, COVID, influenza  Strep test is positive  I did explain findings to the patient.  She is to take Tylenol or ibuprofen for fever as needed.  Drink plenty of fluids.  Gargle warm salt water.  Given a prescription for amoxicillin 875 twice daily.  Patient states she gets yeast with antibiotics so she was given Diflucan 1 now 1 in 1 week.  She is to return emergency department if worsening.  Follow-up with her regular doctor if not improving in 3 to 4 days.  Given a work note and discharged in stable condition.         FINAL CLINICAL IMPRESSION(S) / ED DIAGNOSES   Final diagnoses:  Strep pharyngitis     Rx / DC Orders   ED Discharge Orders  Ordered    amoxicillin (AMOXIL) 875 MG tablet  2 times daily        07/11/21 1706    fluconazole (DIFLUCAN) 150 MG tablet        07/11/21 1706             Note:  This document was prepared using Dragon voice recognition software and may include unintentional dictation errors.    Faythe Ghee, PA-C 07/11/21 1710    Gilles Chiquito, MD 07/11/21 [phone]

## 2021-08-21 ENCOUNTER — Encounter: Payer: Self-pay | Admitting: Emergency Medicine

## 2021-08-21 ENCOUNTER — Emergency Department
Admission: EM | Admit: 2021-08-21 | Discharge: 2021-08-21 | Disposition: A | Discharge status: Home / Self Care | Payer: Medicaid Other | Attending: Emergency Medicine | Admitting: Emergency Medicine

## 2021-08-21 ENCOUNTER — Emergency Department: Payer: Medicaid Other

## 2021-08-21 ENCOUNTER — Other Ambulatory Visit: Payer: Self-pay

## 2021-08-21 DIAGNOSIS — D72829 Elevated white blood cell count, unspecified: Secondary | ICD-10-CM | POA: Diagnosis not present

## 2021-08-21 DIAGNOSIS — O200 Threatened abortion: Secondary | ICD-10-CM | POA: Diagnosis not present

## 2021-08-21 DIAGNOSIS — O99111 Other diseases of the blood and blood-forming organs and certain disorders involving the immune mechanism complicating pregnancy, first trimester: Secondary | ICD-10-CM | POA: Diagnosis not present

## 2021-08-21 DIAGNOSIS — Z3A08 8 weeks gestation of pregnancy: Secondary | ICD-10-CM | POA: Insufficient documentation

## 2021-08-21 DIAGNOSIS — O036 Delayed or excessive hemorrhage following complete or unspecified spontaneous abortion: Secondary | ICD-10-CM

## 2021-08-21 DIAGNOSIS — O26891 Other specified pregnancy related conditions, first trimester: Secondary | ICD-10-CM | POA: Diagnosis present

## 2021-08-21 LAB — CBC WITH DIFFERENTIAL/PLATELET
Abs Immature Granulocytes: 0.04 10*3/uL (ref 0.00–0.07)
Basophils Absolute: 0 10*3/uL (ref 0.0–0.1)
Basophils Relative: 0 %
Eosinophils Absolute: 0.1 10*3/uL (ref 0.0–0.5)
Eosinophils Relative: 1 %
HCT: 35.2 % — ABNORMAL LOW (ref 36.0–46.0)
Hemoglobin: 11.4 g/dL — ABNORMAL LOW (ref 12.0–15.0)
Immature Granulocytes: 0 %
Lymphocytes Relative: 28 %
Lymphs Abs: 3.1 10*3/uL (ref 0.7–4.0)
MCH: 30.7 pg (ref 26.0–34.0)
MCHC: 32.4 g/dL (ref 30.0–36.0)
MCV: 94.9 fL (ref 80.0–100.0)
Monocytes Absolute: 0.7 10*3/uL (ref 0.1–1.0)
Monocytes Relative: 6 %
Neutro Abs: 7 10*3/uL (ref 1.7–7.7)
Neutrophils Relative %: 65 %
Platelets: 206 10*3/uL (ref 150–400)
RBC: 3.71 MIL/uL — ABNORMAL LOW (ref 3.87–5.11)
RDW: 12.9 % (ref 11.5–15.5)
WBC: 11 10*3/uL — ABNORMAL HIGH (ref 4.0–10.5)
nRBC: 0 % (ref 0.0–0.2)

## 2021-08-21 LAB — URINALYSIS, ROUTINE W REFLEX MICROSCOPIC
Bilirubin Urine: NEGATIVE
Glucose, UA: NEGATIVE mg/dL
Ketones, ur: NEGATIVE mg/dL
Nitrite: NEGATIVE
Protein, ur: 30 mg/dL — AB
Specific Gravity, Urine: 1.03 (ref 1.005–1.030)
pH: 5 (ref 5.0–8.0)

## 2021-08-21 LAB — COMPREHENSIVE METABOLIC PANEL
ALT: 21 U/L (ref 0–44)
AST: 22 U/L (ref 15–41)
Albumin: 3.6 g/dL (ref 3.5–5.0)
Alkaline Phosphatase: 37 U/L — ABNORMAL LOW (ref 38–126)
Anion gap: 9 (ref 5–15)
BUN: 12 mg/dL (ref 6–20)
CO2: 23 mmol/L (ref 22–32)
Calcium: 8.6 mg/dL — ABNORMAL LOW (ref 8.9–10.3)
Chloride: 104 mmol/L (ref 98–111)
Creatinine, Ser: 0.84 mg/dL (ref 0.44–1.00)
GFR, Estimated: >60 mL/min (ref >60)
Glucose, Bld: 94 mg/dL (ref 70–99)
Potassium: 3.9 mmol/L (ref 3.5–5.1)
Sodium: 136 mmol/L (ref 135–145)
Total Bilirubin: 0.4 mg/dL (ref 0.3–1.2)
Total Protein: 7.6 g/dL (ref 6.5–8.1)

## 2021-08-21 LAB — HCG, QUANTITATIVE, PREGNANCY: hCG, Beta Chain, Quant, S: 2346 m[IU]/mL — ABNORMAL HIGH (ref <5)

## 2021-08-21 MED ORDER — OXYCODONE-ACETAMINOPHEN 5-325 MG PO TABS: 1.0000 | ORAL_TABLET | Freq: Once | ORAL | Status: DC

## 2021-08-21 MED ORDER — ACETAMINOPHEN 500 MG PO TABS
1000.0000 mg | ORAL_TABLET | Freq: Once | ORAL | Status: AC
  Administered 2021-08-21: 1000 mg via ORAL
  Filled 2021-08-21: qty 2

## 2021-08-21 MED ORDER — HYDROCODONE-ACETAMINOPHEN 5-325 MG PO TABS: 1.0000 | ORAL_TABLET | ORAL | 0 refills | Status: DC | PRN

## 2021-08-21 MED ORDER — METHYLERGONOVINE MALEATE 0.2 MG PO TABS: 0.2000 mg | ORAL_TABLET | Freq: Four times a day (QID) | ORAL | 0 refills | Status: AC

## 2021-08-21 NOTE — ED Notes (Signed)
Pt states she self medicated for pain at approx 4:30 pm with 800mg  ibuprofen with no relief.  ?

## 2021-08-21 NOTE — ED Provider Notes (Signed)
? ?[facility] ?Provider Note ? ?Patient Contact: 4:58 PM (approximate) ? ? ?History  ? ?Abdominal Pain (Radiates to back ) ? ? ?HPI ? ?Meredith Riggs is a 36 y.o. female who presents the emergency department for lower pelvic pain, vaginal bleeding.  Patient states that 5 days ago she took oral medication for abortion.  Patient states that initially she did not have any symptoms, started to develop heavy vaginal bleeding and is now having sharp pelvic pain radiating into her back.  No urinary changes.  No fevers or chills, nausea or vomiting.  No diarrhea or constipation. ?  ? ? ?Physical Exam  ? ?Triage Vital Signs: ?ED Triage Vitals  ?Enc Vitals Group  ?   BP 08/21/21 1621 (!) 119/105  ?   Pulse Rate 08/21/21 1621 73  ?   Resp 08/21/21 1621 18  ?   Temp 08/21/21 1621 98.5 ?F (36.9 ?C)  ?   Temp Source 08/21/21 1621 Oral  ?   SpO2 08/21/21 1621 97 %  ?   Weight 08/21/21 1622 220 lb (99.8 kg)  ?   Height 08/21/21 1622 5\' 8"  (1.727 m)  ?   Head Circumference --   ?   Peak Flow --   ?   Pain Score 08/21/21 1622 9  ?   Pain Loc --   ?   Pain Edu? --   ?   Excl. in GC? --   ? ? ?Most recent vital signs: ?Vitals:  ? 08/21/21 1621  ?BP: (!) 119/105  ?Pulse: 73  ?Resp: 18  ?Temp: 98.5 ?F (36.9 ?C)  ?SpO2: 97%  ? ? ? ?General: Alert and in no acute distress.   ?Cardiovascular:  Good peripheral perfusion ?Respiratory: Normal respiratory effort without tachypnea or retractions. Lungs CTAB. Good air entry to the bases with no decreased or absent breath sounds. ?Gastrointestinal: Bowel sounds ?4 quadrants.  Soft to palpation all quadrants.  Patient is diffusely tender in the bilateral lower quadrants/suprapubic region.. No guarding or rigidity. No palpable masses. No distention. No CVA tenderness. ?Musculoskeletal: Full range of motion to all extremities.  ?Neurologic:  No gross focal neurologic deficits are appreciated.  ?Skin:   No rash noted ?Other: ? ? ?ED Results / Procedures / Treatments   ? ?Labs ?(all labs ordered are listed, but only abnormal results are displayed) ?Labs Reviewed  ?COMPREHENSIVE METABOLIC PANEL - Abnormal; Notable for the following components:  ?    Result Value  ? Calcium 8.6 (*)   ? Alkaline Phosphatase 37 (*)   ? All other components within normal limits  ?CBC WITH DIFFERENTIAL/PLATELET - Abnormal; Notable for the following components:  ? WBC 11.0 (*)   ? RBC 3.71 (*)   ? Hemoglobin 11.4 (*)   ? HCT 35.2 (*)   ? All other components within normal limits  ?HCG, QUANTITATIVE, PREGNANCY - Abnormal; Notable for the following components:  ? hCG, Beta Chain, Quant, S 2,346 (*)   ? All other components within normal limits  ?URINALYSIS, ROUTINE W REFLEX MICROSCOPIC - Abnormal; Notable for the following components:  ? Color, Urine YELLOW (*)   ? APPearance HAZY (*)   ? Hgb urine dipstick LARGE (*)   ? Protein, ur 30 (*)   ? Leukocytes,Ua LARGE (*)   ? Bacteria, UA RARE (*)   ? All other components within normal limits  ? ? ? ?EKG ? ? ? ? ?RADIOLOGY ? ?I personally viewed and evaluated these images as part  of my medical decision making, as well as reviewing the written report by the radiologist. ? ?ED Provider Interpretation: Echogenic material inside the uterus consistent with recent abortion with no identified embryo or gestational sac. ? ?US PELVIC COMPLETE W TRANSVAGINAL AND TORSION R/O ? ?Result Date: 08/21/2021 ?CLINICAL DATA:  Pelvic pain for 5 days EXAM: TRANSABDOMINAL AND TRANSVAGINAL ULTRASOUND OF PELVIS DOPPLER ULTRASOUND OF OVARIES TECHNIQUE: Both transabdominal and transvaginal ultrasound examinations of the pelvis were performed. Transabdominal technique was performed for global imaging of the pelvis including uterus, ovaries, adnexal regions, and pelvic cul-de-sac. It was necessary to proceed with endovaginal exam following the transabdominal exam to visualize the uterus. Color and duplex Doppler ultrasound was utilized to evaluate blood flow to the ovaries. COMPARISON:   None. FINDINGS: Uterus Measurements: 10.7 x 6.4 x 6.9 cm = volume: 246 mL. No fibroids or other mass visualized. Endometrium Thickness: 20. Echogenic material seen within the endometrial cavity. Right ovary Not visualized. Left ovary Not visualized. Other findings No abnormal free fluid. IMPRESSION: 1. Echogenic material seen within the endometrial cavity with no evidence of associated vascularity. 2. Bilateral ovaries not visualized. Electronically Signed   By: Allegra Lai M.D.   On: 08/21/2021 17:20   ? ?PROCEDURES: ? ?Critical Care performed: No ? ?Procedures ? ? ?MEDICATIONS ORDERED IN ED: ?Medications  ?acetaminophen (TYLENOL) tablet 1,000 mg (1,000 mg Oral Given 08/21/21 1725)  ? ? ? ?IMPRESSION / MDM / ASSESSMENT AND PLAN / ED COURSE  ?I reviewed the triage vital signs and the nursing notes. ?             ?               ? ?Differential diagnosis includes, but is not limited to, retained products of conception, vaginal bleeding after abortion, pelvic infection, UTI ? ? ?Patient's diagnosis is consistent with vaginal bleeding after abortion.  Patient presented to the emergency department after having an abortion 5 days ago.  This was a medically induced abortion.  She [facility] from [facility].  Patient has had some bleeding but has developed sharp pelvic pain.  No fevers or chills.  Overall exam was reassuring.  Patient has labs, ultrasound performed.  Labs reveal a falling hCG to a level of 2346 with the patient being reportedly [redacted] weeks pregnant, this is an appropriate follow-up for hCG.  Patient with some blood in her urine which is to be expected given the vaginal bleeding.  CMP is reassuring.  CBC showed white blood cell count of 11,000 but this appears to be just slightly above patient's typical in the 10,000 white blood cell count.  She has no fever or other signs of sepsis or infection.  This is 5 days after medical abortion.  Did talk to OB/GYN who recommended Methergine, 0.2 mg  every 6 hours for 24 hours to help pass remainder of products of conception.  If patient has ongoing bleeding, worsening pain, any development of fever can return to the ED or follow-up with OB..  Patient is given ED precautions to return to the ED for any worsening or new symptoms. ? ? ? ?  ? ? ?FINAL CLINICAL IMPRESSION(S) / ED DIAGNOSES  ? ?Final diagnoses:  ?Abortion with hemorrhage  ? ? ? ?Rx / DC Orders  ? ?ED Discharge Orders   ? ?      Ordered  ?  methylergonovine (METHERGINE) 0.2 MG tablet  4 times daily       ? 08/21/21 1840  ?  HYDROcodone-acetaminophen (NORCO/VICODIN) 5-325 MG tablet  Every 4 hours PRN       ? 08/21/21 1840  ? ?  ?  ? ?  ? ? ? ?Note:  This document was prepared using Dragon voice recognition software and may include unintentional dictation errors. ?  ?Racheal Patches, PA-C ?08/21/21 1843 ? ?  ?Dionne Bucy, MD ?08/21/21 2300 ? ?

## 2021-08-21 NOTE — ED Provider Triage Note (Signed)
Emergency Medicine Provider Triage Evaluation Note ? ?Ardell Isaacs , a 36 y.o. female  was evaluated in triage.  Pt complains of pelvic pain, vaginal bleeding.  Patient had an abortion 5 days ago.  Has had worsening lower pelvic pain and vaginal bleeding over the last 2 to 3 days.  No fevers, chills, urinary changes.. ? ?Review of Systems  ?Positive: Vaginal bleeding, pelvic pain, recent abortion ?Negative: Fevers, chills, urinary changes ? ?Physical Exam  ?BP (!) 119/105   Pulse 73   Temp 98.5 ?F (36.9 ?C) (Oral)   Resp 18   Ht 5\' 8"  (1.727 m)   Wt 99.8 kg   SpO2 97%   BMI 33.45 kg/m?  ?Gen:   Awake, no distress   ?Resp:  Normal effort  ?MSK:   Moves extremities without difficulty  ?Other:  Patient is tender to the lower abdomen/suprapubic region. ? ?Medical Decision Making  ?Medically screening exam initiated at 4:33 PM.  Appropriate orders placed.  was informed that the remainder of the evaluation will be completed by another provider, this initial triage assessment does not replace that evaluation, and the importance of remaining in the ED until their evaluation is complete. ? ?Patient had recent abortion with medicine.  Having worsening pelvic pain, vaginal bleeding. ?  ?Ardell Isaacs, PA-C ?08/21/21 1635 ? ?

## 2021-08-21 NOTE — ED Triage Notes (Addendum)
Pt to ED via POV from work. Pt reports generalized abdominal pain that radiates to her back x2-3 days. Pt reports she had an abortion Saturday via pill. Pt denies N/V/D, CP or SOB. Pt states currently having heavy bleeding.  ?

## 2021-08-21 NOTE — ED Notes (Signed)
See triage note. Pt states flow has been heavy since Sunday. She has been using both pads and tampons so she is unsure how many pads may have been saturated. States she has changed four tampons today. Pt denies NVD, F/C, dizziness. Pt A&Ox4 and ambulatory.  ?

## 2021-08-28 ENCOUNTER — Telehealth: Payer: Self-pay | Admitting: Obstetrics and Gynecology

## 2021-08-28 NOTE — Telephone Encounter (Signed)
Noted. Will order to arrive by apt date/time. 

## 2021-08-28 NOTE — Telephone Encounter (Signed)
Pt is scheduled for Nexplanon placement with ABC on 4/25. ?

## 2021-09-08 ENCOUNTER — Ambulatory Visit: Payer: Medicaid Other | Admitting: Obstetrics and Gynecology

## 2021-09-09 NOTE — Progress Notes (Signed)
? ? ?Meredith Riggs, [place] ? ? ?Chief Complaint  ?Patient presents with  ? Contraception  ?  Nexplanon insertion  ? ? ?HPI: ?     [name] is a 36 y.o. [phone] whose LMP was Patient's last menstrual period was 09/08/2021 (exact date)., presents today for nexplanon insertion. S/p EAB 08/16/21 with oral medication, then started to have heavy bleeding and pelvic pain and went to ED 08/21/21. Serum beta HcG=2346 and GYN u/s showed "Echogenic material seen within the endometrial cavity with no evidence of associated vascularity". Pt was given methergine but she never took it. Pt bled for a wk or so and then stopped bleeding for about 2 wks. Was sexually active then, no birth control. Started her period 2 days ago, flow and dysmen WNL for pt. Here for nexplanon placement. Had nexplanon in past and would like another one.  ?Last pap 09/07/18 was ASCUS/neg HPV. Due for pap today.  ? ?Patient Active Problem List  ? Diagnosis Date Noted  ? History of cesarean section 10/26/2018  ? History of VBAC 10/26/2018  ? Obesity (BMI 30-39.9) 07/20/2017  ? Hidradenitis axillaris   ? ? ?Past Surgical History:  ?Procedure Laterality Date  ? CESAREAN SECTION    ? HYDRADENITIS EXCISION Left 10/15/2015  ? Procedure: EXCISION HIDRADENITIS AXILLA;  Surgeon: Meredith Haw, MD;  Location: [facility] ORS;  Service: General;  Laterality: Left;  ? INDUCED ABORTION    ? ? ?Family History  ?Problem Relation Age of Onset  ? Cancer Maternal Uncle   ?     oral  ? Diabetes Neg Hx   ? Heart disease Neg Hx   ? Hypertension Neg Hx   ? ? ?Social History  ? ?Socioeconomic History  ? Marital status: Single  ?  Spouse name: Not on file  ? Number of children: Not on file  ? Years of education: Not on file  ? Highest education level: Not on file  ?Occupational History  ? Not on file  ?Tobacco Use  ? Smoking status: Never  ? Smokeless tobacco: Never  ?Vaping Use  ? Vaping Use: Never used  ?Substance and Sexual Activity  ? Alcohol use: No  ? Drug use: No   ? Sexual activity: Yes  ?  Birth control/protection: None  ?Other Topics Concern  ? Not on file  ?Social History Narrative  ? ** Merged History Encounter **  ?    ? ?Social Determinants of Health  ? ?Financial Resource Strain: Not on file  ?Food Insecurity: Not on file  ?Transportation Needs: Not on file  ?Physical Activity: Not on file  ?Stress: Not on file  ?Social Connections: Not on file  ?Intimate Partner Violence: Not on file  ? ? ?Outpatient Medications Prior to Visit  ?Medication Sig Dispense Refill  ? fluconazole (DIFLUCAN) 150 MG tablet Take one now and one in a week 2 tablet 0  ? HYDROcodone-acetaminophen (NORCO/VICODIN) 5-325 MG tablet Take 1 tablet by mouth every 4 (four) hours as needed for moderate pain. 8 tablet 0  ? phentermine (ADIPEX-P) 37.5 MG tablet Take 1 tablet (37.5 mg total) by mouth daily before breakfast. 30 tablet 0  ? ?No facility-administered medications prior to visit.  ? ? ? ? ?ROS: ? ?Review of Systems  ?Constitutional:  Negative for fever.  ?Gastrointestinal:  Negative for blood in stool, constipation, diarrhea, nausea and vomiting.  ?Genitourinary:  Negative for dyspareunia, dysuria, flank pain, frequency, hematuria, urgency, vaginal bleeding, vaginal discharge and vaginal pain.  ?  Musculoskeletal:  Negative for back pain.  ?Skin:  Negative for rash.  ?BREAST: No symptoms ? ? ?OBJECTIVE:  ? ?Vitals:  ?BP 110/80   Ht 5\' 8"  (1.727 m)   Wt 228 lb (103.4 kg)   LMP 09/08/2021 (Exact Date)   Breastfeeding No   BMI 34.67 kg/m?  ? ?Physical Exam ?Vitals reviewed.  ?Constitutional:   ?   Appearance: She is well-developed.  ?Pulmonary:  ?   Effort: Pulmonary effort is normal.  ?Genitourinary: ?   General: Normal vulva.  ?   Pubic Area: No rash.   ?   Labia:     ?   Right: No rash, tenderness or lesion.     ?   Left: No rash, tenderness or lesion.   ?   Vagina: Bleeding present. No vaginal discharge, erythema or tenderness.  ?   Cervix: Normal.  ?   Uterus: Normal. Not enlarged and not  tender.   ?   Adnexa: Right adnexa normal and left adnexa normal.    ?   Right: No mass or tenderness.      ?   Left: No mass or tenderness.    ?Musculoskeletal:     ?   General: Normal range of motion.  ?   Cervical back: Normal range of motion.  ?Skin: ?   General: Skin is warm and dry.  ?Neurological:  ?   General: No focal deficit present.  ?   Mental Status: She is alert and oriented to person, place, and time.  ?Psychiatric:     ?   Mood and Affect: Mood normal.     ?   Behavior: Behavior normal.     ?   Thought Content: Thought content normal.     ?   Judgment: Judgment normal.  ? ? ?Results: ?Results for orders placed or performed in visit on 09/10/21 (from the past 24 hour(s))  ?POCT urine pregnancy     Status: Abnormal  ? Collection Time: 09/10/21  9:58 AM  ?Result Value Ref Range  ? Preg Test, Ur Positive (A) Negative  ? ? ? ?Assessment/Plan: ?Positive pregnancy test - Plan: Beta hCG quant (ref lab), 09/12/21 PELVIC COMPLETE WITH TRANSVAGINAL; pt with pos UPT today. Check serum beta HcG and GYN u/s. Will f/u with results and mgmt. Pt to f/u if develops heavy bleeding or severe pelvic pain.  ? ?History of elective abortion - Plan: POCT urine pregnancy, US PELVIC COMPLETE WITH TRANSVAGINAL ? ?Incomplete abortion - Plan: US PELVIC COMPLETE WITH TRANSVAGINAL ? ?Cervical cancer screening - Plan: Cytology - PAP ? ?Screening for STD (sexually transmitted disease) - Plan: Cytology - PAP ? ?Screening for HPV (human papillomavirus) - Plan: Cytology - PAP,  ? ?ASCUS of cervix with negative high risk HPV - Plan: Cytology - PAP,  ? ? ? Return if symptoms worsen or fail to improve. ? ?Meredith Riggs B. Meredith Santiago, PA-C ?09/10/2021 ?10:12 AM ? ? ? ? ? ?

## 2021-09-10 ENCOUNTER — Encounter: Payer: Self-pay | Admitting: Obstetrics and Gynecology

## 2021-09-10 ENCOUNTER — Ambulatory Visit (INDEPENDENT_AMBULATORY_CARE_PROVIDER_SITE_OTHER): Payer: Medicaid Other | Admitting: Obstetrics and Gynecology

## 2021-09-10 ENCOUNTER — Other Ambulatory Visit (HOSPITAL_COMMUNITY)
Admission: RE | Admit: 2021-09-10 | Discharge: 2021-09-10 | Disposition: A | Discharge status: Home / Self Care | Payer: Medicaid Other | Source: Ambulatory Visit | Attending: Obstetrics and Gynecology | Admitting: Obstetrics and Gynecology

## 2021-09-10 VITALS — BP 110/80 | Ht 68.0 in | Wt 228.0 lb

## 2021-09-10 DIAGNOSIS — Z9889 Other specified postprocedural states: Secondary | ICD-10-CM

## 2021-09-10 DIAGNOSIS — Z113 Encounter for screening for infections with a predominantly sexual mode of transmission: Secondary | ICD-10-CM

## 2021-09-10 DIAGNOSIS — O034 Incomplete spontaneous abortion without complication: Secondary | ICD-10-CM | POA: Diagnosis not present

## 2021-09-10 DIAGNOSIS — R87610 Atypical squamous cells of undetermined significance on cytologic smear of cervix (ASC-US): Secondary | ICD-10-CM | POA: Insufficient documentation

## 2021-09-10 DIAGNOSIS — Z1151 Encounter for screening for human papillomavirus (HPV): Secondary | ICD-10-CM | POA: Insufficient documentation

## 2021-09-10 DIAGNOSIS — Z3201 Encounter for pregnancy test, result positive: Secondary | ICD-10-CM | POA: Diagnosis not present

## 2021-09-10 DIAGNOSIS — O074 Failed attempted termination of pregnancy without complication: Secondary | ICD-10-CM | POA: Diagnosis not present

## 2021-09-10 DIAGNOSIS — Z124 Encounter for screening for malignant neoplasm of cervix: Secondary | ICD-10-CM | POA: Diagnosis present

## 2021-09-10 LAB — POCT URINE PREGNANCY: Preg Test, Ur: POSITIVE — AB

## 2021-09-10 NOTE — Patient Instructions (Signed)
I value your feedback and you entrusting us with your care. If you get a National patient survey, I would appreciate you taking the time to let us know about your experience today. Thank you!  Remove the dressing in 24 hours,  keep the incision area dry for 24 hours and remove the Steristrip in 2-3  days.  Notify us if any signs of tenderness, redness, pain, or fevers develop.   

## 2021-09-10 NOTE — Telephone Encounter (Signed)
Apt r/s to 09/10/21 per Epic. ?

## 2021-09-11 LAB — CYTOLOGY - PAP
Adequacy: ABSENT
Chlamydia: NEGATIVE
Comment: NEGATIVE
Comment: NEGATIVE
Comment: NORMAL
Diagnosis: NEGATIVE
High risk HPV: NEGATIVE
Neisseria Gonorrhea: NEGATIVE

## 2021-09-11 LAB — BETA HCG QUANT (REF LAB): hCG Quant: 43 m[IU]/mL

## 2021-09-15 ENCOUNTER — Encounter: Payer: Self-pay | Admitting: Obstetrics and Gynecology

## 2021-09-16 ENCOUNTER — Ambulatory Visit
Admission: RE | Admit: 2021-09-16 | Discharge: 2021-09-16 | Disposition: A | Discharge status: Home / Self Care | Payer: Medicaid Other | Source: Ambulatory Visit | Attending: Obstetrics and Gynecology | Admitting: Obstetrics and Gynecology

## 2021-09-16 DIAGNOSIS — Z3201 Encounter for pregnancy test, result positive: Secondary | ICD-10-CM

## 2021-09-16 DIAGNOSIS — O034 Incomplete spontaneous abortion without complication: Secondary | ICD-10-CM

## 2021-09-16 DIAGNOSIS — Z9889 Other specified postprocedural states: Secondary | ICD-10-CM

## 2021-09-24 NOTE — Telephone Encounter (Signed)
09/10/21-Positive pregnancy test - Plan: Beta hCG quant (ref lab), US PELVIC COMPLETE WITH TRANSVAGINAL; pt with pos UPT today. Check serum beta HcG and GYN u/s. Will f/u with results and mgmt. Pt to f/u if develops heavy bleeding or severe pelvic pain.  ?

## 2021-10-14 ENCOUNTER — Telehealth: Payer: Self-pay | Admitting: Obstetrics

## 2021-10-14 NOTE — Telephone Encounter (Signed)
Patient coming in on 10/22/2021 at 9:15 for nexplanon insertion with Dr Okey Dupre

## 2021-10-15 NOTE — Telephone Encounter (Signed)
Noted. Nexplanon reserved for this patient. 

## 2021-10-22 ENCOUNTER — Ambulatory Visit (INDEPENDENT_AMBULATORY_CARE_PROVIDER_SITE_OTHER): Payer: Medicaid Other | Admitting: Obstetrics

## 2021-10-22 ENCOUNTER — Encounter: Payer: Self-pay | Admitting: Obstetrics

## 2021-10-22 VITALS — BP 118/76 | HR 88 | Ht 68.0 in | Wt 224.0 lb

## 2021-10-22 DIAGNOSIS — Z30015 Encounter for initial prescription of vaginal ring hormonal contraceptive: Secondary | ICD-10-CM

## 2021-10-22 DIAGNOSIS — Z3009 Encounter for other general counseling and advice on contraception: Secondary | ICD-10-CM | POA: Diagnosis not present

## 2021-10-22 LAB — POCT URINE PREGNANCY: Preg Test, Ur: NEGATIVE

## 2021-10-22 MED ORDER — ETONOGESTREL-ETHINYL ESTRADIOL 0.12-0.015 MG/24HR VA RING: VAGINAL_RING | VAGINAL | 2 refills | Status: DC

## 2021-10-22 NOTE — Patient Instructions (Signed)
Have a great year! Please call with any concerns. Don't forget to wear your seatbelt everyday! If you are not signed up on MyChart, please ask [place] how to sign up for it!   In a world where you can be anything, please be kind.   Body mass index is 34.06 kg/m.  A Healthy Lifestyle: Care Instructions Your Care Instructions  A healthy lifestyle can help you feel good, stay at a healthy weight, and have plenty of energy for both work and play. A healthy lifestyle is something you can share with your whole family. A healthy lifestyle also can lower your risk for serious health problems, such as high blood pressure, heart disease, and diabetes. You can follow a few steps listed below to improve your health and the health of your family. Follow-up care is a key part of your treatment and safety. Be sure to make and go to all appointments, and call your doctor if you are having problems. It's also a good idea to know your test results and keep a list of the medicines you take. How can you care for yourself at home? Do not eat too much sugar, fat, or fast foods. You can still have dessert and treats now and then. The goal is moderation. Start small to improve your eating habits. Pay attention to portion sizes, drink less juice and soda pop, and eat more fruits and vegetables. Eat a healthy amount of food. A 3-ounce serving of meat, for example, is about the size of a deck of cards. Fill the rest of your plate with vegetables and whole grains. Limit the amount of soda and sports drinks you have every day. Drink more water when you are thirsty. Eat at least 5 servings of fruits and vegetables every day. It may seem like a lot, but it is not hard to reach this goal. A serving or helping is 1 piece of fruit, 1 cup of vegetables, or 2 cups of leafy, raw vegetables. Have an apple or some carrot sticks as an afternoon snack instead of a candy bar. Try to have fruits and/or vegetables at every meal. Make exercise  part of your daily routine. You may want to start with simple activities, such as walking, bicycling, or slow swimming. Try to be active 30 to 60 minutes every day. You do not need to do all 30 to 60 minutes all at once. For example, you can exercise 3 times a day for 10 or 20 minutes. Moderate exercise is safe for most people, but it is always a good idea to talk to your doctor before starting an exercise program. Keep moving. Mow the lawn, work in the garden, or [employer]. Take the stairs instead of the elevator at work. If you smoke, quit. People who smoke have an increased risk for heart attack, stroke, cancer, and other lung illnesses. Quitting is hard, but there are ways to boost your chance of quitting tobacco for good. Use nicotine gum, patches, or lozenges. Ask your doctor about stop-smoking programs and medicines. Keep trying. In addition to reducing your risk of diseases in the future, you will notice some benefits soon after you stop using tobacco. If you have shortness of breath or asthma symptoms, they will likely get better within a few weeks after you quit. Limit how much alcohol you drink. Moderate amounts of alcohol (up to 2 drinks a day for men, 1 drink a day for women) are okay. But drinking too much can lead to  liver problems, high blood pressure, and other health problems. Family health If you have a family, there are many things you can do together to improve your health. Eat meals together as a family as often as possible. Eat healthy foods. This includes fruits, vegetables, lean meats and dairy, and whole grains. Include your family in your fitness plan. Most people think of activities such as jogging or tennis as the way to fitness, but there are many ways you and your family can be more active. Anything that makes you breathe hard and gets your heart pumping is exercise. Here are some tips: Walk to do errands or to take your child to school or the bus. Go for a family  bike ride after dinner instead of watching TV. Care instructions adapted under license by your healthcare professional. This care instruction is for use with your licensed healthcare professional. If you have questions about a medical condition or this instruction, always ask your healthcare professional. [place] any warranty or liability for your use of this information.

## 2021-10-22 NOTE — Progress Notes (Signed)
Chief Complaint  Patient presents with   Contraception    Nexplanon placement   Patient Meredith Riggs is an 36 y.o. year old [phone] No LMP recorded (lmp unknown). currently nothing for contraception who presents for Nexplanon insertion. However she has had one that had to be surgically removed with gen surg and she also bled every day when on it. She had recent miscarriage and wants something reliable.  Review of Systems  Constitutional:  Negative for activity change, appetite change, chills, diaphoresis, fatigue, fever and unexpected weight change.  HENT:  Negative for congestion, dental problem, drooling, ear discharge, ear pain, facial swelling, hearing loss, mouth sores, nosebleeds, postnasal drip, rhinorrhea, sinus pressure, sinus pain, sneezing, sore throat, tinnitus, trouble swallowing and voice change.   Eyes:  Negative for photophobia, pain, discharge, redness, itching and visual disturbance.  Respiratory:  Negative for apnea, cough, choking, chest tightness, shortness of breath, wheezing and stridor.   Cardiovascular:  Negative for chest pain, palpitations and leg swelling.  Gastrointestinal:  Negative for abdominal distention, abdominal pain, anal bleeding, blood in stool, constipation, diarrhea, nausea, rectal pain and vomiting.  Endocrine: Negative for cold intolerance, heat intolerance, polydipsia, polyphagia and polyuria.  Genitourinary:  Negative for decreased urine volume, difficulty urinating, dyspareunia, dysuria, enuresis, flank pain, frequency, genital sores, hematuria, menstrual problem, pelvic pain, urgency, vaginal bleeding, vaginal discharge and vaginal pain.  Musculoskeletal:  Negative for arthralgias, back pain, gait problem, joint swelling, myalgias, neck pain and neck stiffness.  Skin:  Negative for color change, pallor, rash and wound.  Allergic/Immunologic: Negative for environmental allergies, food allergies and immunocompromised state.  Neurological:  Negative  for dizziness, tremors, seizures, syncope, facial asymmetry, speech difficulty, weakness, light-headedness, numbness and headaches.  Hematological:  Negative for adenopathy. Does not bruise/bleed easily.  Psychiatric/Behavioral:  Negative for agitation, behavioral problems, confusion, decreased concentration, dysphoric mood, hallucinations, self-injury, sleep disturbance and suicidal ideas. The patient is not nervous/anxious and is not hyperactive.     Past Medical History:  Diagnosis Date   Herpes genitalis    Vaginal Pap smear, abnormal    Pt. don't know when the last one was abn.    Past Surgical History:  Procedure Laterality Date   CESAREAN SECTION     HYDRADENITIS EXCISION Left 10/15/2015   Procedure: EXCISION HIDRADENITIS AXILLA;  Surgeon: Florene Glen, MD;  Location: [facility] ORS;  Service: General;  Laterality: Left;   INDUCED ABORTION     Family History  Problem Relation Age of Onset   Cancer Maternal Uncle        oral   Diabetes Neg Hx    Heart disease Neg Hx    Hypertension Neg Hx    Social History   Socioeconomic History   Marital status: Single    Spouse name: Not on file   Number of children: Not on file   Years of education: Not on file   Highest education level: Not on file  Occupational History   Not on file  Tobacco Use   Smoking status: Never   Smokeless tobacco: Never  Vaping Use   Vaping Use: Never used  Substance and Sexual Activity   Alcohol use: No   Drug use: No   Sexual activity: Yes    Birth control/protection: None  Other Topics Concern   Not on file  Social History Narrative   ** Merged History Encounter **       Social Determinants of Health   Financial Resource Strain: Not on file  Food Insecurity:  Not on file  Transportation Needs: Not on file  Physical Activity: Not on file  Stress: Not on file  Social Connections: Not on file  Intimate Partner Violence: Not on file    Medicine list and allergies reviewed and updated.     BP 118/76 (Patient Position: Sitting, Cuff Size: Normal)   Pulse 88   Ht 5\' 8"  (1.727 m)   Wt 224 lb (101.6 kg)   LMP  (LMP Unknown)   BMI 34.06 kg/m  Physical Exam Vitals and nursing note reviewed.  Constitutional:      Appearance: Normal appearance.  HENT:     Head: Normocephalic and atraumatic.  Eyes:     Extraocular Movements: Extraocular movements intact.  Pulmonary:     Effort: Pulmonary effort is normal.  Musculoskeletal:        General: Normal range of motion.     Cervical back: Normal range of motion.  Skin:    General: Skin is warm and dry.  Neurological:     General: No focal deficit present.     Mental Status: She is alert and oriented to person, place, and time.  Psychiatric:        Mood and Affect: Mood normal.        Behavior: Behavior normal.        Thought Content: Thought content normal.    Birth control counseling - Plan: POCT urine pregnancy, etonogestrel-ethinyl estradiol (NUVARING) 0.12-0.015 MG/24HR vaginal ring  Encounter for initial prescription of vaginal ring hormonal contraceptive  Education given regarding options for contraception including: Paragard IUD Progesterone only options include Depo Provera, Mirena, Kyleena, Liletta, pills Micronor vs Slynd and Nexplanon.  Combo options include: Pills, Nuvaring vs Annovera, patch  Patient denies history of migraine headaches with aura, uncontrolled hypertension, liver disease or blood clots. in general patient should be on lowest amount of estrogen possible to limit side effects.  After discussion of options she opts for trial of nuvaring with transition to anovera if she like it Rx done Will need BP check  Return in about 3 months (around 01/22/2022) for contraception follow up.

## 2021-12-21 ENCOUNTER — Telehealth: Payer: Self-pay

## 2021-12-21 NOTE — Telephone Encounter (Signed)
Pt calling triage  wanting an appt for Nexplanon placement. Please help schedule this and her Annual GYN exam , as she is over due for it

## 2021-12-22 ENCOUNTER — Telehealth: Payer: Self-pay | Admitting: Obstetrics & Gynecology

## 2021-12-22 ENCOUNTER — Other Ambulatory Visit: Payer: Self-pay

## 2021-12-22 DIAGNOSIS — Z3009 Encounter for other general counseling and advice on contraception: Secondary | ICD-10-CM

## 2021-12-22 MED ORDER — ETONOGESTREL-ETHINYL ESTRADIOL 0.12-0.015 MG/24HR VA RING: VAGINAL_RING | VAGINAL | 0 refills | Status: DC

## 2021-12-22 NOTE — Telephone Encounter (Signed)
Pt calling for refill of nuvaring.  [phone]  Pt states needs one nuvaring to get her to her appt on the 29th for annual and nexplanon insertion.  Adv will send order in.

## 2021-12-22 NOTE — Telephone Encounter (Signed)
Pt is schedule for Nexplanon placement with Dr. Marice Potter on 8/29.  (This is  a rescheduled appt.)

## 2021-12-22 NOTE — Telephone Encounter (Signed)
Noted. Will order to arrive by appointment date/time. 

## 2022-01-12 ENCOUNTER — Ambulatory Visit (INDEPENDENT_AMBULATORY_CARE_PROVIDER_SITE_OTHER): Payer: Medicaid Other | Admitting: Obstetrics & Gynecology

## 2022-01-12 ENCOUNTER — Encounter: Payer: Self-pay | Admitting: Obstetrics & Gynecology

## 2022-01-12 VITALS — BP 120/80 | Ht 68.0 in | Wt 228.0 lb

## 2022-01-12 DIAGNOSIS — Z30017 Encounter for initial prescription of implantable subdermal contraceptive: Secondary | ICD-10-CM

## 2022-01-12 LAB — POCT URINE PREGNANCY: Preg Test, Ur: NEGATIVE

## 2022-01-12 MED ORDER — ETONOGESTREL 68 MG ~~LOC~~ IMPL
68.0000 mg | DRUG_IMPLANT | Freq: Once | SUBCUTANEOUS | Status: AC
  Administered 2022-01-12: 68 mg via SUBCUTANEOUS

## 2022-01-12 NOTE — Progress Notes (Signed)
   Subjective:    Patient ID: Meredith Riggs, female    DOB: 03-04-1986, 36 y.o.   MRN: [id number]  HPI 36 yo single P3 here for Nexplanon insertion. She had her Nexplnaon removed 10/22/2021 due to unwanted bleeding and headaches. She tried the Nuvaring but reports that it won't stay in. She has not used it for 2 weeks. She did have one occasion of unprotected sex about 10 days ago and used Plan B. She then had a normal period and finished this recently. A UPT here today is negative.  She is willing to resume irregular bleeding and headaches because she doesn't want any other form of contraception. She tried Mirena in the past but said that it caused her pain.   Review of Systems She had a normal pap smear 08/2021. She declines BLT.    Objective:   Physical Exam Well nourished, well hydrated Black female, no apparent distress She is ambulating and conversing normally. UPT was negative. Consent was signed. Time out procedure was done. Her left arm was prepped with betadine and infiltrated with 3 cc of 1% lidocaine at the site of her previous Nexplanon.  After adequate anesthesia was assured, the Nexplanon device was placed according to standard of care. Her arm was hemostatic and was bandaged. She tolerated the procedure well.        Assessment & Plan:  Contraception- Nexplanon I rec'd 2 weeks of a back up method/abstinence. Come back 1 year or sooner as necessary

## 2022-01-19 ENCOUNTER — Encounter: Payer: Self-pay | Admitting: Obstetrics & Gynecology

## 2022-01-20 NOTE — Telephone Encounter (Signed)
Pt was scheduled with Dr. Marice Potter on 8/29 at 1:35 for her annual and Nexplanon placement.

## 2023-01-25 ENCOUNTER — Ambulatory Visit: Payer: Medicaid Other | Admitting: Obstetrics & Gynecology

## 2023-02-23 IMAGING — US US PELVIS COMPLETE TRANSABD/TRANSVAG W DUPLEX AND/OR DOPPLER
1 series · 14 of 25 positions shown · non-contrast
Comparison: None.

CLINICAL DATA: Pelvic pain for 5 days

EXAM:
TRANSABDOMINAL AND TRANSVAGINAL ULTRASOUND OF PELVIS
DOPPLER ULTRASOUND OF OVARIES
TECHNIQUE: Both transabdominal and transvaginal ultrasound examinations of the
pelvis were performed. Transabdominal technique was performed for
global imaging of the pelvis including uterus, ovaries, adnexal
regions, and pelvic cul-de-sac.
It was necessary to proceed with endovaginal exam following the
transabdominal exam to visualize the uterus. Color and duplex
Doppler ultrasound was utilized to evaluate blood flow to the
ovaries.

[Series 1: us pelvic complete w transvaginal and torsion righ · 14 of 79 slices shown]
[im 1/79]
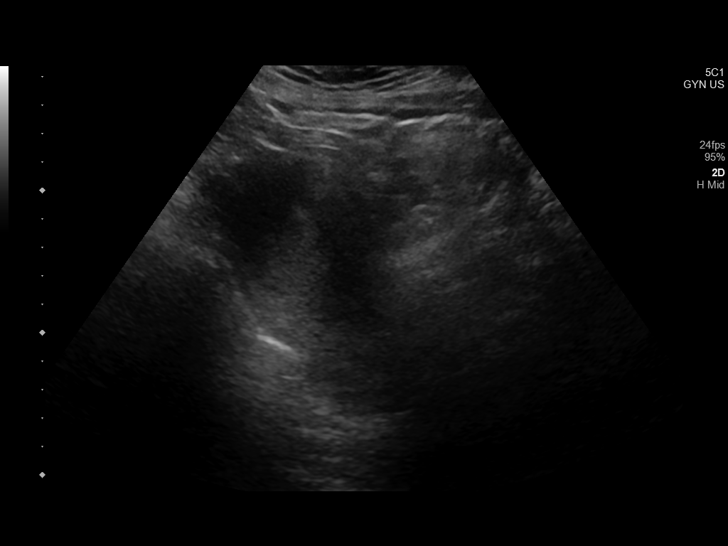
[im 7/79]
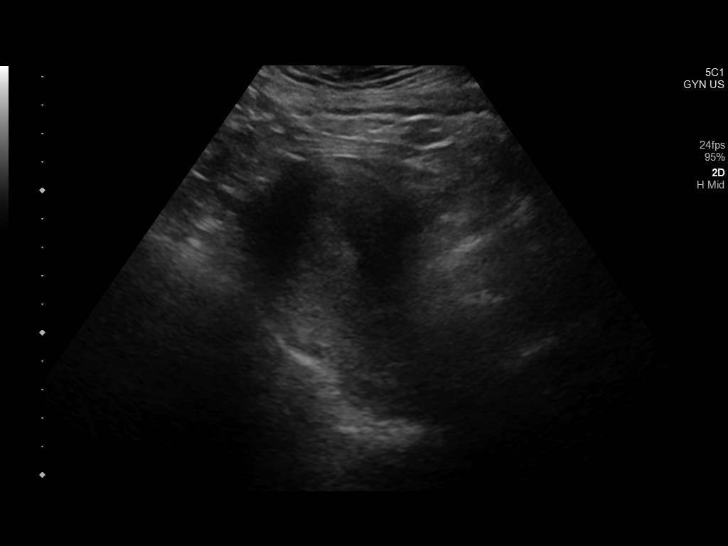
[im 14/79]
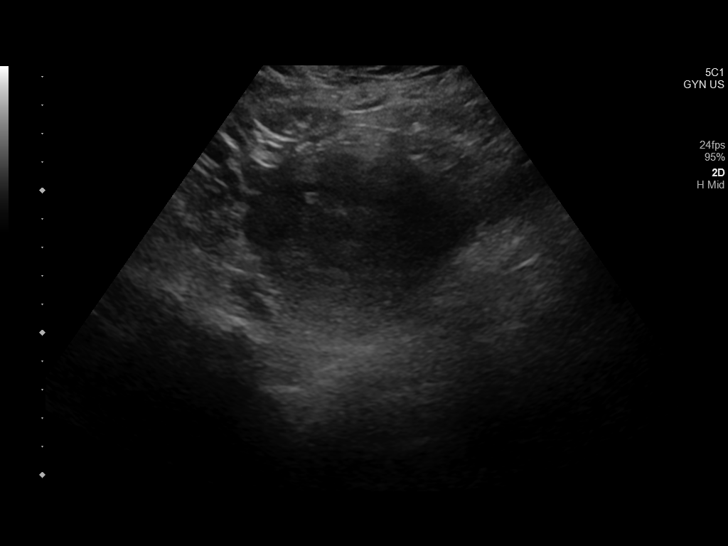
[im 20/79]
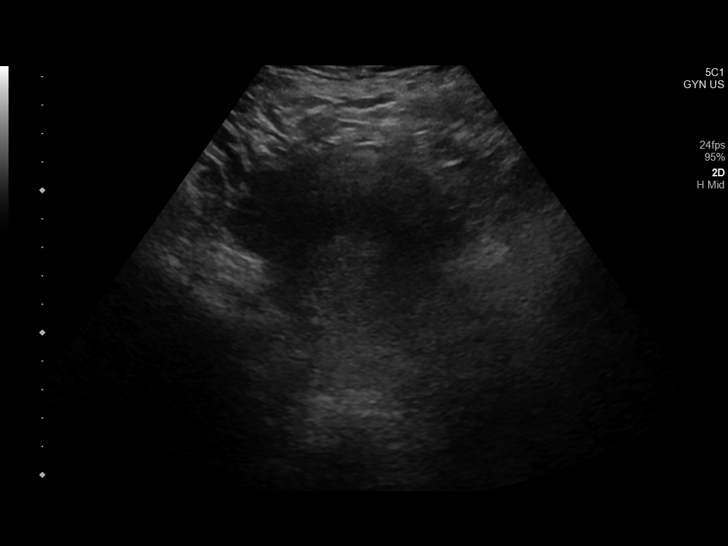
[im 27/79]
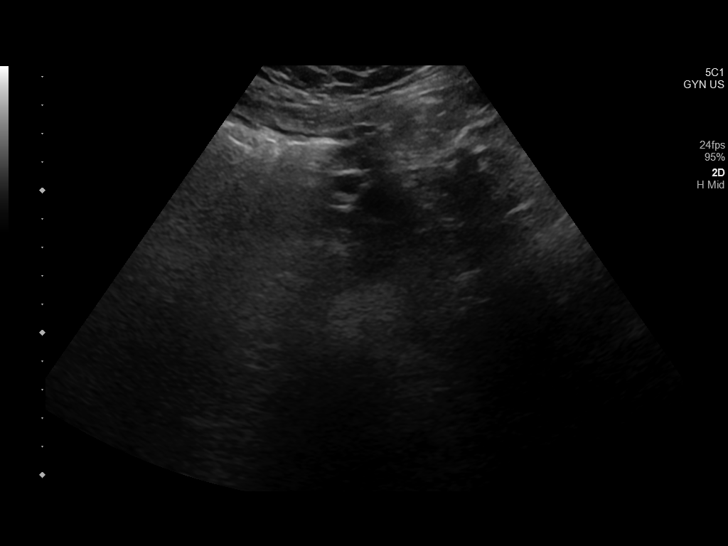
[im 30/79]
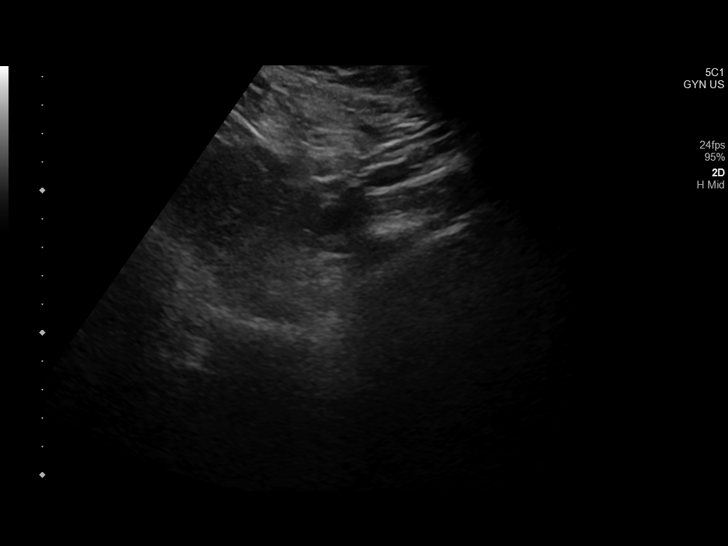
[im 36/79]
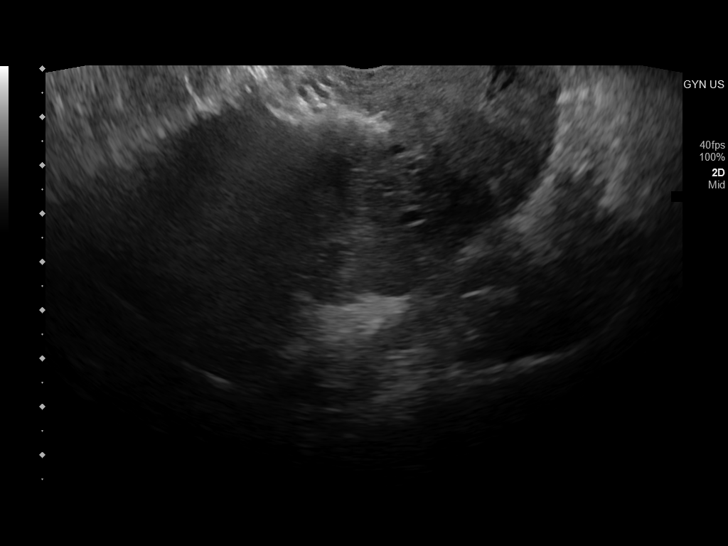
[im 43/79]
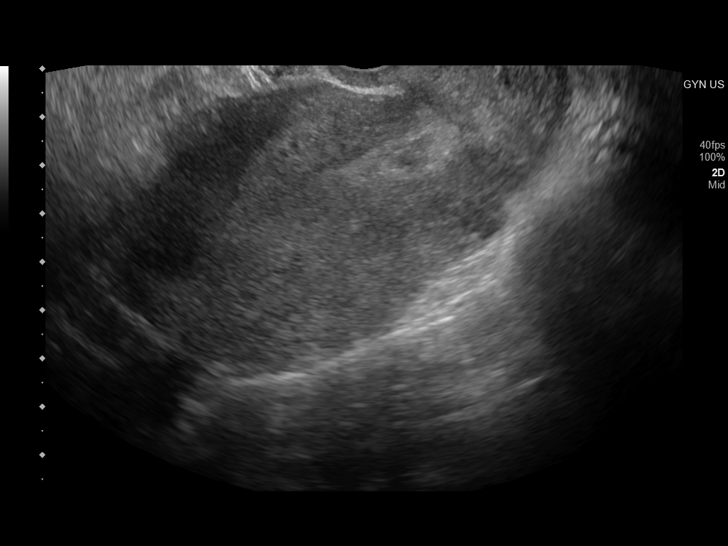
[im 49/79]
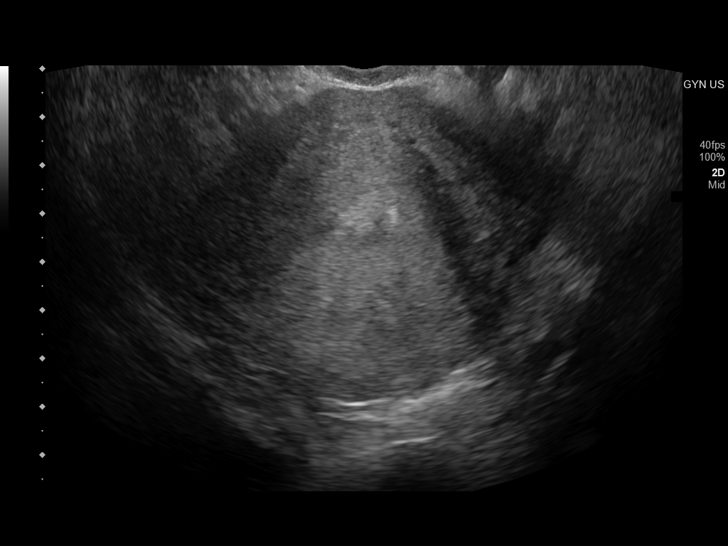
[im 53/79]
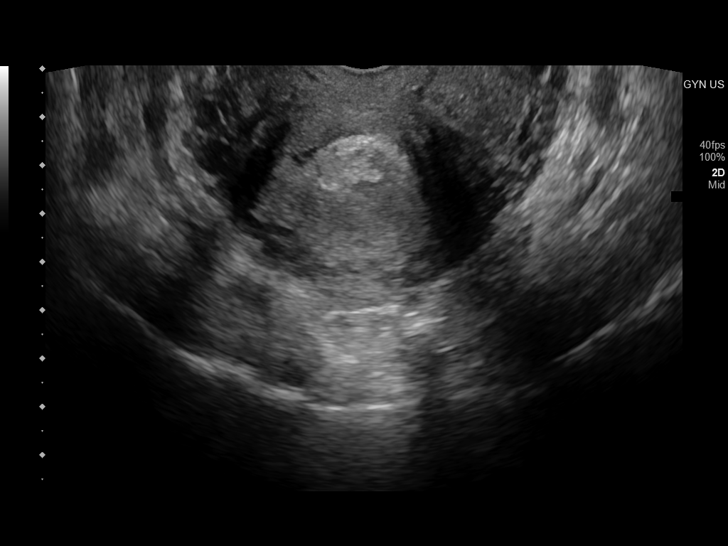
[im 59/79]
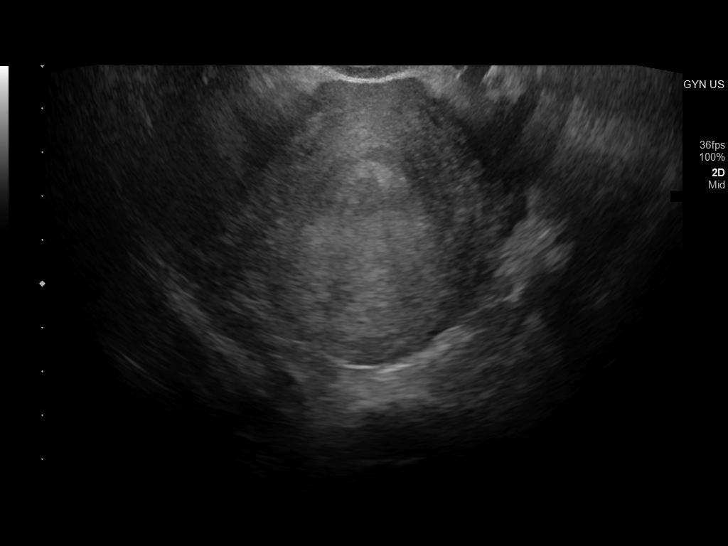
[im 66/79]
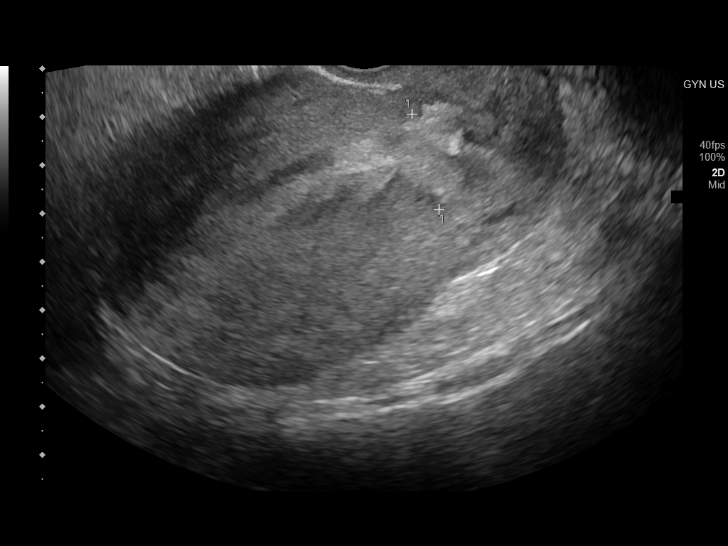
[im 72/79]
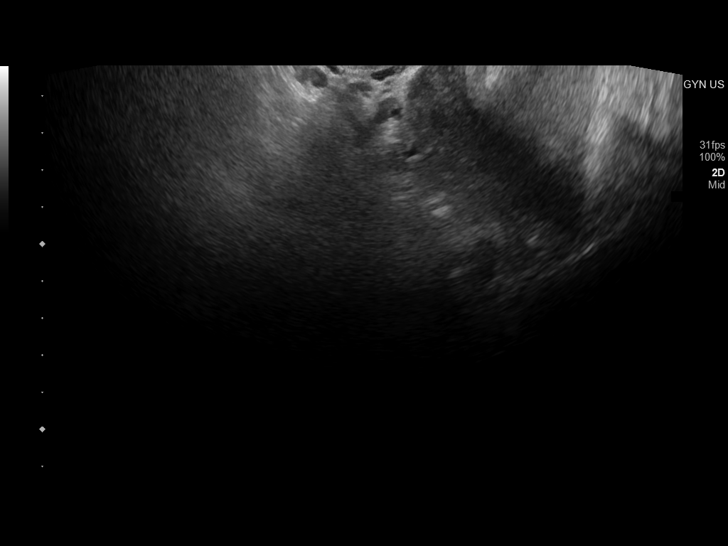
[im 79/79]
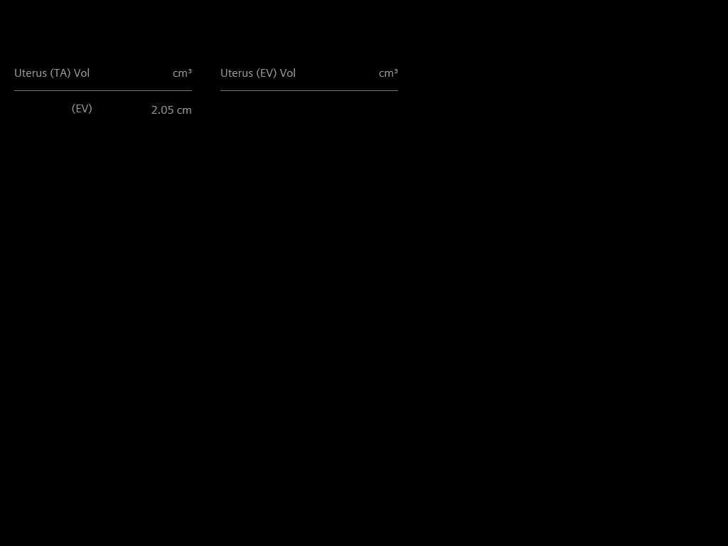

[14 of 25 positions shown; findings below may reference images not displayed]

FINDINGS: Uterus

Measurements: 10.7 x 6.4 x 6.9 cm = volume: 246 mL. No fibroids or
other mass visualized.

Endometrium

Thickness: 20. Echogenic material seen within the endometrial
cavity.

Right ovary

Not visualized.

Left ovary

Not visualized.

Other findings

No abnormal free fluid.
IMPRESSION: 1. Echogenic material seen within the endometrial cavity with no
evidence of associated vascularity.
2. Bilateral ovaries not visualized.

## 2023-08-30 ENCOUNTER — Ambulatory Visit (INDEPENDENT_AMBULATORY_CARE_PROVIDER_SITE_OTHER): Admitting: Family

## 2023-08-30 ENCOUNTER — Encounter: Payer: Self-pay | Admitting: Family

## 2023-08-30 VITALS — BP 118/72 | HR 79 | Ht 68.0 in | Wt 206.2 lb

## 2023-08-30 DIAGNOSIS — E049 Nontoxic goiter, unspecified: Secondary | ICD-10-CM | POA: Diagnosis not present

## 2023-08-30 DIAGNOSIS — R5383 Other fatigue: Secondary | ICD-10-CM

## 2023-08-30 DIAGNOSIS — I10 Essential (primary) hypertension: Secondary | ICD-10-CM

## 2023-08-30 DIAGNOSIS — E538 Deficiency of other specified B group vitamins: Secondary | ICD-10-CM | POA: Diagnosis not present

## 2023-08-30 DIAGNOSIS — E559 Vitamin D deficiency, unspecified: Secondary | ICD-10-CM | POA: Diagnosis not present

## 2023-08-30 DIAGNOSIS — E782 Mixed hyperlipidemia: Secondary | ICD-10-CM

## 2023-08-30 DIAGNOSIS — R7303 Prediabetes: Secondary | ICD-10-CM

## 2023-08-30 DIAGNOSIS — Z1159 Encounter for screening for other viral diseases: Secondary | ICD-10-CM

## 2023-08-30 NOTE — Progress Notes (Signed)
 Sleep: 4/10

## 2023-08-30 NOTE — Progress Notes (Signed)
 New Patient Office Visit  Subjective  Patient ID: Meredith Riggs, female    DOB: [birth year]  Age: 38 y.o. MRN: [id number]  CC:  Chief Complaint  Patient presents with   Establish Care    NPE    HPI Meredith Riggs presents to establish care Previous Primary Care provider/office: did not have.  she does have additional concerns to discuss today.   She recently had Strep Throat and Influenza at the same time, and says that she was told when that occurred that she had an enlarged thyroid and that she needed to have this checked out.   She feels well overall, but needed to establish, as she has not had a PCP.     Outpatient Encounter Medications as of 08/30/2023  Medication Sig   etonogestrel (NEXPLANON) 68 MG IMPL implant 1 each by Subdermal route once.   No facility-administered encounter medications on file as of 08/30/2023.    Past Medical History:  Diagnosis Date   Herpes genitalis    History of cesarean section 10/26/2018   History of VBAC 10/26/2018   Vaginal Pap smear, abnormal    Pt. don't know when the last one was abn.     Past Surgical History:  Procedure Laterality Date   CESAREAN SECTION     HYDRADENITIS EXCISION Left 10/15/2015   Procedure: EXCISION HIDRADENITIS AXILLA;  Surgeon: Lattie Haw, MD;  Location: [facility] ORS;  Service: General;  Laterality: Left;   INDUCED ABORTION      Family History  Problem Relation Age of Onset   Cancer Maternal Uncle        oral   Diabetes Neg Hx    Heart disease Neg Hx    Hypertension Neg Hx     Social History   Socioeconomic History   Marital status: Single    Spouse name: Not on file   Number of children: Not on file   Years of education: Not on file   Highest education level: Not on file  Occupational History   Not on file  Tobacco Use   Smoking status: Never   Smokeless tobacco: Never  Vaping Use   Vaping status: Never Used  Substance and Sexual Activity   Alcohol use: No   Drug use: No    Sexual activity: Yes    Birth control/protection: None  Other Topics Concern   Not on file  Social History Narrative   ** Merged History Encounter **       Social Drivers of Health   Financial Resource Strain: Low Risk  (07/26/2023)   Received from [facility]   Overall Financial Resource Strain (CARDIA)    Difficulty of Paying Living Expenses: Not hard at all  Food Insecurity: No Food Insecurity (07/26/2023)   Received from [facility]   Hunger Vital Sign    Worried About Running Out of Food in the Last Year: Never true    Ran Out of Food in the Last Year: Never true  Transportation Needs: No Transportation Needs (07/26/2023)   [facility] from [facility] - Transportation    In the past 12 months, has lack of transportation kept you from medical appointments or from getting medications?: No    Lack of Transportation (Non-Medical): No  Physical Activity: Not on file  Stress: Not on file  Social Connections: Not on file  Intimate Partner Violence: Not on file    Review of Systems  All other  systems reviewed and are negative.     Objective   BP 118/72   Pulse 79   Ht 5\' 8"  (1.727 m)   Wt 206 lb 3.2 oz (93.5 kg)   SpO2 98%   BMI 31.35 kg/m   Physical Exam Vitals and nursing note reviewed.  Constitutional:      Appearance: Normal appearance. She is normal weight.  HENT:     Head: Normocephalic.  Eyes:     Extraocular Movements: Extraocular movements intact.     Conjunctiva/sclera: Conjunctivae normal.     Pupils: Pupils are equal, round, and reactive to light.  Neck:     Thyroid: Thyromegaly present.  Cardiovascular:     Rate and Rhythm: Normal rate.  Pulmonary:     Effort: Pulmonary effort is normal.  Neurological:     General: No focal deficit present.     Mental Status: She is alert and oriented to person, place, and time. Mental status is at baseline.  Psychiatric:        Mood and Affect: Mood  normal.        Behavior: Behavior normal.        Thought Content: Thought content normal.        Assessment & Plan:   Problem List Items Addressed This Visit       Endocrine   Thyroid enlarged - Primary   Ultrasound ordered today and checking labs as well.        Relevant Orders   US THYROID   CMP14+EGFR   TSH   CBC with Diff   Other Visit Diagnoses       B12 deficiency due to diet       Checking labs today.  Will continue supplements as needed.   Relevant Orders   CMP14+EGFR   Vitamin B12   CBC with Diff     Vitamin D deficiency, unspecified       Checking labs today.  Will continue supplements as needed.   Relevant Orders   VITAMIN D 25 Hydroxy (Vit-D Deficiency, Fractures)   CMP14+EGFR   CBC with Diff     Prediabetes       A1C is in prediabetic ranges. Patient counseled on dietary choices and verbalized understanding. Will reassess at follow up after next lab check.   Relevant Orders   CMP14+EGFR   Hemoglobin A1c   CBC with Diff     Mixed hyperlipidemia       Checking labs today.  Continue current therapy for lipid control. Will modify as needed based on labwork results.   Relevant Orders   Lipid panel   CMP14+EGFR   CBC with Diff     Essential hypertension, benign       Blood pressure well controlled with current medications.  Continue current therapy.  Will reassess at follow up.   Relevant Orders   CMP14+EGFR   CBC with Diff     Other fatigue       Relevant Orders   CMP14+EGFR   TSH   CBC with Diff     Need for hepatitis C screening test       Test ordered in office today. Will call with results.   Relevant Orders   Hepatitis C Ab reflex to Quant PCR       Return in about 2 weeks (around 09/13/2023) for F/U.   Total time spent: 30 minutes  Miki Kins, FNP  08/30/2023  This document may have been prepared by [employer] Voice  Recognition software and as such may include unintentional dictation errors.

## 2023-08-30 NOTE — Assessment & Plan Note (Signed)
 Ultrasound ordered today and checking labs as well.

## 2023-08-31 LAB — CBC WITH DIFFERENTIAL/PLATELET
Basophils Absolute: 0 10*3/uL (ref 0.0–0.2)
Basos: 1 %
EOS (ABSOLUTE): 0.1 10*3/uL (ref 0.0–0.4)
Eos: 1 %
Hematocrit: 40.8 % (ref 34.0–46.6)
Hemoglobin: 13 g/dL (ref 11.1–15.9)
Immature Grans (Abs): 0 10*3/uL (ref 0.0–0.1)
Immature Granulocytes: 0 %
Lymphocytes Absolute: 2.8 10*3/uL (ref 0.7–3.1)
Lymphs: 31 %
MCH: 30.7 pg (ref 26.6–33.0)
MCHC: 31.9 g/dL (ref 31.5–35.7)
MCV: 97 fL (ref 79–97)
Monocytes Absolute: 0.6 10*3/uL (ref 0.1–0.9)
Monocytes: 6 %
Neutrophils Absolute: 5.4 10*3/uL (ref 1.4–7.0)
Neutrophils: 61 %
Platelets: 233 10*3/uL (ref 150–450)
RBC: 4.23 x10E6/uL (ref 3.77–5.28)
RDW: 12.7 % (ref 11.7–15.4)
WBC: 8.9 10*3/uL (ref 3.4–10.8)

## 2023-08-31 LAB — VITAMIN D 25 HYDROXY (VIT D DEFICIENCY, FRACTURES): Vit D, 25-Hydroxy: 18 ng/mL — ABNORMAL LOW (ref 30.0–100.0)

## 2023-08-31 LAB — CMP14+EGFR
ALT: 13 IU/L (ref 0–32)
AST: 19 IU/L (ref 0–40)
Albumin: 4.6 g/dL (ref 3.9–4.9)
Alkaline Phosphatase: 76 IU/L (ref 44–121)
BUN/Creatinine Ratio: 9 (ref 9–23)
BUN: 9 mg/dL (ref 6–20)
Bilirubin Total: 0.3 mg/dL (ref 0.0–1.2)
CO2: 21 mmol/L (ref 20–29)
Calcium: 9.8 mg/dL (ref 8.7–10.2)
Chloride: 102 mmol/L (ref 96–106)
Creatinine, Ser: 1.01 mg/dL — ABNORMAL HIGH (ref 0.57–1.00)
Globulin, Total: 2.6 g/dL (ref 1.5–4.5)
Glucose: 108 mg/dL — ABNORMAL HIGH (ref 70–99)
Potassium: 4 mmol/L (ref 3.5–5.2)
Sodium: 139 mmol/L (ref 134–144)
Total Protein: 7.2 g/dL (ref 6.0–8.5)
eGFR: 74 mL/min/{1.73_m2} (ref >59)

## 2023-08-31 LAB — LIPID PANEL
Chol/HDL Ratio: 2.7 ratio (ref 0.0–4.4)
Cholesterol, Total: 136 mg/dL (ref 100–199)
HDL: 50 mg/dL (ref >39)
LDL Chol Calc (NIH): 71 mg/dL (ref 0–99)
Triglycerides: 79 mg/dL (ref 0–149)
VLDL Cholesterol Cal: 15 mg/dL (ref 5–40)

## 2023-08-31 LAB — HEMOGLOBIN A1C
Est. average glucose Bld gHb Est-mCnc: 94 mg/dL
Hgb A1c MFr Bld: 4.9 % (ref 4.8–5.6)

## 2023-08-31 LAB — VITAMIN B12: Vitamin B-12: 316 pg/mL (ref 232–1245)

## 2023-08-31 LAB — HCV INTERPRETATION

## 2023-08-31 LAB — HCV AB W REFLEX TO QUANT PCR: HCV Ab: NONREACTIVE

## 2023-08-31 LAB — TSH: TSH: 0.448 u[IU]/mL — ABNORMAL LOW (ref 0.450–4.500)

## 2023-09-01 ENCOUNTER — Ambulatory Visit
Admission: RE | Admit: 2023-09-01 | Discharge: 2023-09-01 | Disposition: A | Discharge status: Home / Self Care | Source: Ambulatory Visit | Attending: Family | Admitting: Family

## 2023-09-01 DIAGNOSIS — E049 Nontoxic goiter, unspecified: Secondary | ICD-10-CM

## 2023-09-13 ENCOUNTER — Encounter: Payer: Self-pay | Admitting: Family

## 2023-09-13 ENCOUNTER — Ambulatory Visit: Admitting: Family

## 2023-09-13 VITALS — BP 110/72 | HR 74 | Ht 68.0 in | Wt 204.8 lb

## 2023-09-13 DIAGNOSIS — E049 Nontoxic goiter, unspecified: Secondary | ICD-10-CM

## 2023-09-13 DIAGNOSIS — R946 Abnormal results of thyroid function studies: Secondary | ICD-10-CM | POA: Diagnosis not present

## 2023-09-13 DIAGNOSIS — R5383 Other fatigue: Secondary | ICD-10-CM | POA: Diagnosis not present

## 2023-09-13 DIAGNOSIS — Z0130 Encounter for examination of blood pressure without abnormal findings: Secondary | ICD-10-CM

## 2023-09-13 MED ORDER — VITAMIN D (ERGOCALCIFEROL) 1.25 MG (50000 UNIT) PO CAPS: 50000.0000 [IU] | ORAL_CAPSULE | ORAL | 1 refills | Status: DC

## 2023-09-13 NOTE — Progress Notes (Signed)
 Established Patient Office Visit  Subjective:  Patient ID: Meredith Riggs, female    DOB: 05/21/1985  Age: 38 y.o. MRN: [id number]  Chief Complaint  Patient presents with   Follow-up    2 week follow up    Pt. Here today for 2 week n/p follow up.   Had labs done at that time, so we will review in detail today.  Labs: TSH is very low, plan to recheck today, add T3, T4, and TPO.  Vitamin D  also very low and her B12 was borderline. Reports severe fatigue as well: will add iron labs.   No other concerns today.      No other concerns at this time.   Past Medical History:  Diagnosis Date   Herpes genitalis    History of cesarean section 10/26/2018   History of VBAC 10/26/2018   Vaginal Pap smear, abnormal    Pt. don't know when the last one was abn.     Past Surgical History:  Procedure Laterality Date   CESAREAN SECTION     HYDRADENITIS EXCISION Left 10/15/2015   Procedure: EXCISION HIDRADENITIS AXILLA;  Surgeon: Claudia Cuff, MD;  Location: [facility] ORS;  Service: General;  Laterality: Left;   INDUCED ABORTION      Social History   Socioeconomic History   Marital status: Single    Spouse name: Not on file   Number of children: Not on file   Years of education: Not on file   Highest education level: Not on file  Occupational History   Not on file  Tobacco Use   Smoking status: Never   Smokeless tobacco: Never  Vaping Use   Vaping status: Never Used  Substance and Sexual Activity   Alcohol use: No   Drug use: No   Sexual activity: Yes    Birth control/protection: None  Other Topics Concern   Not on file  Social History Narrative   ** Merged History Encounter **       Social Drivers of Health   Financial Resource Strain: Low Risk  (07/26/2023)   Received from [facility]   Overall Financial Resource Strain (CARDIA)    Difficulty of Paying Living Expenses: Not hard at all  Food Insecurity: No Food Insecurity (07/26/2023)   Received  from [facility]   Hunger Vital Sign    Worried About Running Out of Food in the Last Year: Never true    Ran Out of Food in the Last Year: Never true  Transportation Needs: No Transportation Needs (07/26/2023)   Received from [facility] - Transportation    In the past 12 months, has lack of transportation kept you from medical appointments or from getting medications?: No    Lack of Transportation (Non-Medical): No  Physical Activity: Not on file  Stress: Not on file  Social Connections: Not on file  Intimate Partner Violence: Not on file    Family History  Problem Relation Age of Onset   Cancer Maternal Uncle        oral   Diabetes Neg Hx    Heart disease Neg Hx    Hypertension Neg Hx     No Known Allergies  Review of Systems  Constitutional:  Positive for malaise/fatigue.  All other systems reviewed and are negative.      Objective:   BP 110/72   Pulse 74   Ht 5\' 8"  (1.727 m)   Wt 204 lb  12.8 oz (92.9 kg)   SpO2 95%   BMI 31.14 kg/m   Vitals:   09/13/23 1308  BP: 110/72  Pulse: 74  Height: 5\' 8"  (1.727 m)  Weight: 204 lb 12.8 oz (92.9 kg)  SpO2: 95%  BMI (Calculated): 31.15    Physical Exam Vitals and nursing note reviewed.  Constitutional:      Appearance: Normal appearance. She is normal weight.  HENT:     Head: Normocephalic.  Eyes:     Extraocular Movements: Extraocular movements intact.     Conjunctiva/sclera: Conjunctivae normal.     Pupils: Pupils are equal, round, and reactive to light.  Cardiovascular:     Rate and Rhythm: Normal rate.  Pulmonary:     Effort: Pulmonary effort is normal.  Neurological:     General: No focal deficit present.     Mental Status: She is alert and oriented to person, place, and time. Mental status is at baseline.  Psychiatric:        Mood and Affect: Mood normal.        Behavior: Behavior normal.        Thought Content: Thought content normal.         Judgment: Judgment normal.      No results found for any visits on 09/13/23.  Recent Results (from the past 2160 hours)  Lipid panel     Status: None   Collection Time: 08/30/23 10:47 AM  Result Value Ref Range   Cholesterol, Total 136 100 - 199 mg/dL   Triglycerides 79 0 - 149 mg/dL   HDL 50 >04 mg/dL   VLDL Cholesterol Cal 15 5 - 40 mg/dL   LDL Chol Calc (NIH) 71 0 - 99 mg/dL   Chol/HDL Ratio 2.7 0.0 - 4.4 ratio    Comment:                                   T. Chol/HDL Ratio                                             Men  Women                               1/2 Avg.Risk  3.4    3.3                                   Avg.Risk  5.0    4.4                                2X Avg.Risk  9.6    7.1                                3X Avg.Risk 23.4   11.0   VITAMIN D  25 Hydroxy (Vit-D Deficiency, Fractures)     Status: Abnormal   Collection Time: 08/30/23 10:47 AM  Result Value Ref Range   Vit D, 25-Hydroxy 18.0 (L) 30.0 - 100.0 ng/mL    Comment: Vitamin D  deficiency has been defined by the Institute  of Medicine and an Endocrine Society practice guideline as a level of serum 25-OH vitamin D  less than 20 ng/mL (1,2). The Endocrine Society went on to further define vitamin D  insufficiency as a level between 21 and 29 ng/mL (2). 1. IOM (Institute of Medicine). 2010. Dietary reference    intakes for calcium and D. [place]: The    [employer]. 2. Holick MF, Binkley [place], Bischoff-Ferrari HA, et al.    Evaluation, treatment, and prevention of vitamin D     deficiency: an Endocrine Society clinical practice    guideline. JCEM. 2011 Jul; 96(7):1911-30.   CMP14+EGFR     Status: Abnormal   Collection Time: 08/30/23 10:47 AM  Result Value Ref Range   Glucose 108 (H) 70 - 99 mg/dL   BUN 9 6 - 20 mg/dL   Creatinine, Ser 2.95 (H) 0.57 - 1.00 mg/dL   eGFR 74 >28 UX/LKG/4.01   BUN/Creatinine Ratio 9 9 - 23   Sodium 139 134 - 144 mmol/L   Potassium 4.0 3.5 - 5.2 mmol/L    Chloride 102 96 - 106 mmol/L   CO2 21 20 - 29 mmol/L   Calcium 9.8 8.7 - 10.2 mg/dL   Total Protein 7.2 6.0 - 8.5 g/dL   Albumin 4.6 3.9 - 4.9 g/dL   Globulin, Total 2.6 1.5 - 4.5 g/dL   Bilirubin Total 0.3 0.0 - 1.2 mg/dL   Alkaline Phosphatase 76 44 - 121 IU/L   AST 19 0 - 40 IU/L   ALT 13 0 - 32 IU/L  TSH     Status: Abnormal   Collection Time: 08/30/23 10:47 AM  Result Value Ref Range   TSH 0.448 (L) 0.450 - 4.500 uIU/mL  Hemoglobin A1c     Status: None   Collection Time: 08/30/23 10:47 AM  Result Value Ref Range   Hgb A1c MFr Bld 4.9 4.8 - 5.6 %    Comment:          Prediabetes: 5.7 - 6.4          Diabetes: >6.4          Glycemic control for adults with diabetes: <7.0    Est. average glucose Bld gHb Est-mCnc 94 mg/dL  Vitamin B12     Status: None   Collection Time: 08/30/23 10:47 AM  Result Value Ref Range   Vitamin B-12 316 232 - 1,245 pg/mL  CBC with Diff     Status: None   Collection Time: 08/30/23 10:47 AM  Result Value Ref Range   WBC 8.9 3.4 - 10.8 x10E3/uL   RBC 4.23 3.77 - 5.28 x10E6/uL   Hemoglobin 13.0 11.1 - 15.9 g/dL   Hematocrit 02.7 25.3 - 46.6 %   MCV 97 79 - 97 fL   MCH 30.7 26.6 - 33.0 pg   MCHC 31.9 31.5 - 35.7 g/dL   RDW 66.4 40.3 - 47.4 %   Platelets 233 150 - 450 x10E3/uL   Neutrophils 61 Not Estab. %   Lymphs 31 Not Estab. %   Monocytes 6 Not Estab. %   Eos 1 Not Estab. %   Basos 1 Not Estab. %   Neutrophils Absolute 5.4 1.4 - 7.0 x10E3/uL   Lymphocytes Absolute 2.8 0.7 - 3.1 x10E3/uL   Monocytes Absolute 0.6 0.1 - 0.9 x10E3/uL   EOS (ABSOLUTE) 0.1 0.0 - 0.4 x10E3/uL   Basophils Absolute 0.0 0.0 - 0.2 x10E3/uL   Immature Granulocytes 0 Not Estab. %   Immature Grans (Abs) 0.0 0.0 -  0.1 x10E3/uL  Hepatitis C Ab reflex to Quant PCR     Status: None   Collection Time: 08/30/23 10:47 AM  Result Value Ref Range   HCV Ab Non Reactive Non Reactive  Interpretation:     Status: None   Collection Time: 08/30/23 10:47 AM  Result Value Ref Range    HCV Interp 1: Comment     Comment: Not infected with HCV unless early or acute infection is suspected (which may be delayed in an immunocompromised individual), or other evidence exists to indicate HCV infection.        Assessment & Plan:   Problem List Items Addressed This Visit       Endocrine   Thyroid  enlarged - Primary   Relevant Orders   TSH+T4F+T3Free   Anti-TPO Ab (RDL)   Other Visit Diagnoses       Nonspecific abnormal results of thyroid  function study       Relevant Orders   TSH+T4F+T3Free   Anti-TPO Ab (RDL)     Other fatigue       Relevant Orders   Iron, TIBC and Ferritin Panel      Adding full thyroid  panel and iron labs.   Return in about 2 weeks (around 09/27/2023).   Total time spent: 20 minutes  Trenda Frisk, FNP  09/13/2023   This document may have been prepared by [facility] Voice Recognition software and as such may include unintentional dictation errors.

## 2023-09-14 LAB — IRON,TIBC AND FERRITIN PANEL
Ferritin: 29 ng/mL (ref 15–150)
Iron Saturation: 27 % (ref 15–55)
Iron: 86 ug/dL (ref 27–159)
Total Iron Binding Capacity: 323 ug/dL (ref 250–450)
UIBC: 237 ug/dL (ref 131–425)

## 2023-09-17 LAB — TSH+T4F+T3FREE
Free T4: 1.09 ng/dL (ref 0.82–1.77)
T3, Free: 2.9 pg/mL (ref 2.0–4.4)
TSH: 1.49 u[IU]/mL (ref 0.450–4.500)

## 2023-09-17 LAB — ANTI-TPO AB (RDL): Anti-TPO Ab (RDL): <9 [IU]/mL (ref <9.0)

## 2023-09-30 ENCOUNTER — Encounter: Payer: Self-pay | Admitting: Family

## 2023-09-30 ENCOUNTER — Ambulatory Visit: Admitting: Family

## 2023-09-30 VITALS — BP 122/76 | HR 84 | Ht 68.0 in | Wt 207.2 lb

## 2023-09-30 DIAGNOSIS — L732 Hidradenitis suppurativa: Secondary | ICD-10-CM | POA: Diagnosis not present

## 2023-09-30 DIAGNOSIS — E782 Mixed hyperlipidemia: Secondary | ICD-10-CM

## 2023-09-30 DIAGNOSIS — E049 Nontoxic goiter, unspecified: Secondary | ICD-10-CM

## 2023-09-30 DIAGNOSIS — E559 Vitamin D deficiency, unspecified: Secondary | ICD-10-CM

## 2023-09-30 DIAGNOSIS — E669 Obesity, unspecified: Secondary | ICD-10-CM | POA: Diagnosis not present

## 2023-09-30 DIAGNOSIS — Z0130 Encounter for examination of blood pressure without abnormal findings: Secondary | ICD-10-CM

## 2023-09-30 NOTE — Progress Notes (Signed)
 Established Patient Office Visit  Subjective:  Patient ID: Meredith Riggs, female    DOB: [birth year]  Age: 38 y.o. MRN: [id number]  Chief Complaint  Patient presents with   Follow-up    2 week follow up    Pt. Here today for 2 week n/p follow up.   Had labs done at that time, so we will review in detail today.  Still needs referral to Endocrine to evaluate her thyroid  issues.    No other concerns today.     Past Medical History:  Diagnosis Date   Herpes genitalis    History of cesarean section 10/26/2018   History of VBAC 10/26/2018   Vaginal Pap smear, abnormal    Pt. don't know when the last one was abn.     Past Surgical History:  Procedure Laterality Date   CESAREAN SECTION     HYDRADENITIS EXCISION Left 10/15/2015   Procedure: EXCISION HIDRADENITIS AXILLA;  Surgeon: Charlie FORBES Fell, MD;  Location: [facility] ORS;  Service: General;  Laterality: Left;   INDUCED ABORTION      Social History   Socioeconomic History   Marital status: Single    Spouse name: Not on file   Number of children: Not on file   Years of education: Not on file   Highest education level: Not on file  Occupational History   Not on file  Tobacco Use   Smoking status: Never   Smokeless tobacco: Never  Vaping Use   Vaping status: Never Used  Substance and Sexual Activity   Alcohol use: No   Drug use: No   Sexual activity: Yes    Birth control/protection: None  Other Topics Concern   Not on file  Social History Narrative   ** Merged History Encounter **       Social Drivers of Health   Financial Resource Strain: Low Risk  (07/26/2023)   Received from [facility]   Overall Financial Resource Strain (CARDIA)    Difficulty of Paying Living Expenses: Not hard at all  Food Insecurity: No Food Insecurity (07/26/2023)   Received from [facility]   Hunger Vital Sign    Worried About Running Out of Food in the Last Year: Never true    Ran Out of Food  in the Last Year: Never true  Transportation Needs: No Transportation Needs (07/26/2023)   Received from [facility] - Transportation    In the past 12 months, has lack of transportation kept you from medical appointments or from getting medications?: No    Lack of Transportation (Non-Medical): No  Physical Activity: Not on file  Stress: Not on file  Social Connections: Not on file  Intimate Partner Violence: Not on file    Family History  Problem Relation Age of Onset   Cancer Maternal Uncle        oral   Diabetes Neg Hx    Heart disease Neg Hx    Hypertension Neg Hx     No Known Allergies  Review of Systems  All other systems reviewed and are negative.      Objective:   BP 122/76   Pulse 84   Ht 5' 8 (1.727 m)   Wt 207 lb 3.2 oz (94 kg)   SpO2 97%   BMI 31.50 kg/m   Vitals:   09/30/23 1301  BP: 122/76  Pulse: 84  Height: 5' 8 (1.727 m)  Weight: 207 lb 3.2 oz (  94 kg)  SpO2: 97%  BMI (Calculated): 31.51    Physical Exam Vitals and nursing note reviewed.  Constitutional:      Appearance: Normal appearance. She is obese.  HENT:     Head: Normocephalic and atraumatic.  Eyes:     Extraocular Movements: Extraocular movements intact.     Conjunctiva/sclera: Conjunctivae normal.     Pupils: Pupils are equal, round, and reactive to light.  Neck:     Thyroid : Thyromegaly present.  Cardiovascular:     Rate and Rhythm: Normal rate.  Pulmonary:     Effort: Pulmonary effort is normal.  Neurological:     General: No focal deficit present.     Mental Status: She is alert and oriented to person, place, and time. Mental status is at baseline.  Psychiatric:        Mood and Affect: Mood normal.        Behavior: Behavior normal.        Thought Content: Thought content normal.        Judgment: Judgment normal.      No results found for any visits on 09/30/23.  Recent Results (from the past 2160 hours)  Lipid panel     Status:  None   Collection Time: 08/30/23 10:47 AM  Result Value Ref Range   Cholesterol, Total 136 100 - 199 mg/dL   Triglycerides 79 0 - 149 mg/dL   HDL 50 >60 mg/dL   VLDL Cholesterol Cal 15 5 - 40 mg/dL   LDL Chol Calc (NIH) 71 0 - 99 mg/dL   Chol/HDL Ratio 2.7 0.0 - 4.4 ratio    Comment:                                   T. Chol/HDL Ratio                                             Men  Women                               1/2 Avg.Risk  3.4    3.3                                   Avg.Risk  5.0    4.4                                2X Avg.Risk  9.6    7.1                                3X Avg.Risk 23.4   11.0   VITAMIN D  25 Hydroxy (Vit-D Deficiency, Fractures)     Status: Abnormal   Collection Time: 08/30/23 10:47 AM  Result Value Ref Range   Vit D, 25-Hydroxy 18.0 (L) 30.0 - 100.0 ng/mL    Comment: Vitamin D  deficiency has been defined by the Institute of Medicine and an Endocrine Society practice guideline as a level of serum 25-OH vitamin D  less than 20 ng/mL (1,2). The Endocrine Society went on to further  define vitamin D  insufficiency as a level between 21 and 29 ng/mL (2). 1. IOM (Institute of Medicine). 2010. Dietary reference    intakes for calcium and D. [place]: The    [employer]. 2. Holick MF, Binkley [place], Bischoff-Ferrari HA, et al.    Evaluation, treatment, and prevention of vitamin D     deficiency: an Endocrine Society clinical practice    guideline. JCEM. 2011 Jul; 96(7):1911-30.   CMP14+EGFR     Status: Abnormal   Collection Time: 08/30/23 10:47 AM  Result Value Ref Range   Glucose 108 (H) 70 - 99 mg/dL   BUN 9 6 - 20 mg/dL   Creatinine, Ser 8.98 (H) 0.57 - 1.00 mg/dL   eGFR 74 >40 fO/fpw/8.26   BUN/Creatinine Ratio 9 9 - 23   Sodium 139 134 - 144 mmol/L   Potassium 4.0 3.5 - 5.2 mmol/L   Chloride 102 96 - 106 mmol/L   CO2 21 20 - 29 mmol/L   Calcium 9.8 8.7 - 10.2 mg/dL   Total Protein 7.2 6.0 - 8.5 g/dL   Albumin 4.6 3.9 - 4.9 g/dL    Globulin, Total 2.6 1.5 - 4.5 g/dL   Bilirubin Total 0.3 0.0 - 1.2 mg/dL   Alkaline Phosphatase 76 44 - 121 IU/L   AST 19 0 - 40 IU/L   ALT 13 0 - 32 IU/L  TSH     Status: Abnormal   Collection Time: 08/30/23 10:47 AM  Result Value Ref Range   TSH 0.448 (L) 0.450 - 4.500 uIU/mL  Hemoglobin A1c     Status: None   Collection Time: 08/30/23 10:47 AM  Result Value Ref Range   Hgb A1c MFr Bld 4.9 4.8 - 5.6 %    Comment:          Prediabetes: 5.7 - 6.4          Diabetes: >6.4          Glycemic control for adults with diabetes: <7.0    Est. average glucose Bld gHb Est-mCnc 94 mg/dL  Vitamin B12     Status: None   Collection Time: 08/30/23 10:47 AM  Result Value Ref Range   Vitamin B-12 316 232 - 1,245 pg/mL  CBC with Diff     Status: None   Collection Time: 08/30/23 10:47 AM  Result Value Ref Range   WBC 8.9 3.4 - 10.8 x10E3/uL   RBC 4.23 3.77 - 5.28 x10E6/uL   Hemoglobin 13.0 11.1 - 15.9 g/dL   Hematocrit 59.1 65.9 - 46.6 %   MCV 97 79 - 97 fL   MCH 30.7 26.6 - 33.0 pg   MCHC 31.9 31.5 - 35.7 g/dL   RDW 87.2 88.2 - 84.5 %   Platelets 233 150 - 450 x10E3/uL   Neutrophils 61 Not Estab. %   Lymphs 31 Not Estab. %   Monocytes 6 Not Estab. %   Eos 1 Not Estab. %   Basos 1 Not Estab. %   Neutrophils Absolute 5.4 1.4 - 7.0 x10E3/uL   Lymphocytes Absolute 2.8 0.7 - 3.1 x10E3/uL   Monocytes Absolute 0.6 0.1 - 0.9 x10E3/uL   EOS (ABSOLUTE) 0.1 0.0 - 0.4 x10E3/uL   Basophils Absolute 0.0 0.0 - 0.2 x10E3/uL   Immature Granulocytes 0 Not Estab. %   Immature Grans (Abs) 0.0 0.0 - 0.1 x10E3/uL  Hepatitis C Ab reflex to Quant PCR     Status: None   Collection Time: 08/30/23 10:47 AM  Result Value Ref  Range   HCV Ab Non Reactive Non Reactive  Interpretation:     Status: None   Collection Time: 08/30/23 10:47 AM  Result Value Ref Range   HCV Interp 1: Comment     Comment: Not infected with HCV unless early or acute infection is suspected (which may be delayed in an  immunocompromised individual), or other evidence exists to indicate HCV infection.   TSH+T4F+T3Free     Status: None   Collection Time: 09/13/23  1:30 PM  Result Value Ref Range   TSH 1.490 0.450 - 4.500 uIU/mL   T3, Free 2.9 2.0 - 4.4 pg/mL   Free T4 1.09 0.82 - 1.77 ng/dL  Anti-TPO Ab (RDL)     Status: None   Collection Time: 09/13/23  1:30 PM  Result Value Ref Range   Anti-TPO Ab (RDL) <9.0 <9.0 IU/mL  Iron, TIBC and Ferritin Panel     Status: None   Collection Time: 09/13/23  1:33 PM  Result Value Ref Range   Total Iron Binding Capacity 323 250 - 450 ug/dL   UIBC 762 868 - 574 ug/dL   Iron 86 27 - 840 ug/dL   Iron Saturation 27 15 - 55 %   Ferritin 29 15 - 150 ng/mL       Assessment & Plan Thyroid  enlarged Setting patient up for referral to endocrinology.  Will defer to them for further treatment changes.  Reassess at follow up.  Hidradenitis axillaris Pt has heard from previous provider and has appointment set with her.  Will defer to them for further treatment decisions.   Obesity (BMI 30-39.9) Continue current meds.  Will adjust as needed based on results.  The patient is asked to make an attempt to improve diet and exercise patterns to aid in medical management of this problem. Addressed importance of increasing and maintaining water intake.   Vitamin D  deficiency, unspecified Vitamin D  was <20 Patient counseled that this could be affecting his energy level and bone health.   Sending supplementation for pt.  Will recheck at follow up.   Mixed hyperlipidemia Checking labs today.  Continue current therapy for lipid control. Will modify as needed based on labwork results.   -CMP w/eGFR -Lipid Panel    Return in about 1 month (around 10/31/2023) for F/U.   Total time spent: 20 minutes  ALAN CHRISTELLA ARRANT, FNP  09/30/2023   This document may have been prepared by [facility] Voice Recognition software and as such may include unintentional dictation  errors.

## 2023-11-01 ENCOUNTER — Ambulatory Visit: Admitting: Family

## 2023-11-16 ENCOUNTER — Ambulatory Visit: Admitting: Family

## 2023-12-18 ENCOUNTER — Encounter: Payer: Self-pay | Admitting: Family

## 2023-12-18 NOTE — Assessment & Plan Note (Signed)
 Setting patient up for referral to endocrinology.  Will defer to them for further treatment changes.  Reassess at follow up.

## 2023-12-18 NOTE — Assessment & Plan Note (Signed)
 Continue current meds.  Will adjust as needed based on results.  The patient is asked to make an attempt to improve diet and exercise patterns to aid in medical management of this problem. Addressed importance of increasing and maintaining water intake.

## 2023-12-18 NOTE — Assessment & Plan Note (Signed)
 Pt has heard from previous provider and has appointment set with her.  Will defer to them for further treatment decisions.

## 2024-01-18 ENCOUNTER — Ambulatory Visit (INDEPENDENT_AMBULATORY_CARE_PROVIDER_SITE_OTHER): Admitting: Family

## 2024-01-18 ENCOUNTER — Encounter: Payer: Self-pay | Admitting: Family

## 2024-01-18 DIAGNOSIS — R5383 Other fatigue: Secondary | ICD-10-CM | POA: Diagnosis not present

## 2024-01-18 DIAGNOSIS — R7303 Prediabetes: Secondary | ICD-10-CM

## 2024-01-18 DIAGNOSIS — E538 Deficiency of other specified B group vitamins: Secondary | ICD-10-CM | POA: Diagnosis not present

## 2024-01-18 DIAGNOSIS — E782 Mixed hyperlipidemia: Secondary | ICD-10-CM

## 2024-01-18 DIAGNOSIS — E559 Vitamin D deficiency, unspecified: Secondary | ICD-10-CM | POA: Diagnosis not present

## 2024-01-18 DIAGNOSIS — I10 Essential (primary) hypertension: Secondary | ICD-10-CM

## 2024-01-18 DIAGNOSIS — E049 Nontoxic goiter, unspecified: Secondary | ICD-10-CM

## 2024-01-18 MED ORDER — VITAMIN D (ERGOCALCIFEROL) 1.25 MG (50000 UNIT) PO CAPS: 50000.0000 [IU] | ORAL_CAPSULE | ORAL | 1 refills | Status: AC

## 2024-01-18 NOTE — Patient Instructions (Addendum)
 [facility] at [facility] at [facility] [address] [place], [place] [address]  Phone: [phone]

## 2024-01-18 NOTE — Assessment & Plan Note (Signed)
 Checking labs today.  Will continue supplements as needed.   - Vitamin D  - Vitamin B12 - TSH

## 2024-01-18 NOTE — Progress Notes (Signed)
 Established Patient Office Visit  Subjective:  Patient ID: Meredith Riggs, female    DOB: 1986-05-13  Age: 38 y.o. MRN: [id number]  Chief Complaint  Patient presents with   Follow-up    Patient is here today for her 3 months follow up.  She has been feeling fairly well since last appointment.   She does have additional concerns to discuss today.  Has not heard about the referral to Endocrine.   Labs are due today.  She needs refills.   I have reviewed her active problem list, medication list, allergies, notes from last encounter, lab results for her appointment today.     No other concerns at this time.   Past Medical History:  Diagnosis Date   Herpes genitalis    History of cesarean section 10/26/2018   History of VBAC 10/26/2018   Vaginal Pap smear, abnormal    Pt. don't know when the last one was abn.     Past Surgical History:  Procedure Laterality Date   CESAREAN SECTION     HYDRADENITIS EXCISION Left 10/15/2015   Procedure: EXCISION HIDRADENITIS AXILLA;  Surgeon: Charlie FORBES Fell, MD;  Location: [facility] OR[facility];  Service: General;  Laterality: Left;   INDUCED ABORTION      Social History   Socioeconomic History   Marital status: Single    Spouse name: Not on file   Number of children: Not on file   Years of education: Not on file   Highest education level: Not on file  Occupational History   Not on file  Tobacco Use   Smoking status: Never   Smokeless tobacco: Never  Vaping Use   Vaping status: Never Used  Substance and Sexual Activity   Alcohol use: No   Drug use: No   Sexual activity: Yes    Birth control/protection: None  Other Topics Concern   Not on file  Social History Narrative   ** Merged History Encounter **       Social Drivers of Health   Financial Resource Strain: Low Risk  (07/26/2023)   Received from [facility]   Overall Financial Resource Strain (CARDIA)    Difficulty of Paying Living Expenses: Not hard at  all  Food Insecurity: No Food Insecurity (07/26/2023)   Received from [facility]   Hunger Vital Sign    Within the past 12 months, you worried that your food would run out before you got the money to buy more.: Never true    Within the past 12 months, the food you bought just didn't last and you didn't have money to get more.: Never true  Transportation Needs: No Transportation Needs (07/26/2023)   Received from [facility] - Transportation    In the past 12 months, has lack of transportation kept you from medical appointments or from getting medications?: No    Lack of Transportation (Non-Medical): No  Physical Activity: Not on file  Stress: Not on file  Social Connections: Not on file  Intimate Partner Violence: Not on file    Family History  Problem Relation Age of Onset   Cancer Maternal Uncle        oral   Diabetes Neg Hx    Heart disease Neg Hx    Hypertension Neg Hx     No Known Allergies  Review of Systems  All other systems reviewed and are negative.      Objective:   BP 127/82  Pulse 77   Ht 5' 8 (1.727 m)   Wt 207 lb 12.8 oz (94.3 kg)   SpO2 99%   BMI 31.60 kg/m   Vitals:   01/18/24 1524  BP: 127/82  Pulse: 77  Height: 5' 8 (1.727 m)  Weight: 207 lb 12.8 oz (94.3 kg)  SpO2: 99%  BMI (Calculated): 31.6    Physical Exam Vitals and nursing note reviewed.  Constitutional:      Appearance: Normal appearance. She is normal weight.  HENT:     Head: Normocephalic.  Eyes:     Extraocular Movements: Extraocular movements intact.     Conjunctiva/sclera: Conjunctivae normal.     Pupils: Pupils are equal, round, and reactive to light.  Cardiovascular:     Rate and Rhythm: Normal rate.  Pulmonary:     Effort: Pulmonary effort is normal.  Neurological:     General: No focal deficit present.     Mental Status: She is alert and oriented to person, place, and time. Mental status is at baseline.   Psychiatric:        Mood and Affect: Mood normal.        Behavior: Behavior normal.        Thought Content: Thought content normal.        Judgment: Judgment normal.      No results found for any visits on 01/18/24.  No results found for this or any previous visit (from the past 2160 hours).     Assessment & Plan Vitamin D  deficiency, unspecified B12 deficiency due to diet Other fatigue Thyroid  enlarged Checking labs today.  Will continue supplements as needed.   - Vitamin D  - Vitamin B12 - TSH  Prediabetes A1C Continues to be in prediabetic ranges.  Will reassess at follow up after next lab check.  Patient counseled on dietary choices and verbalized understanding.   -CBC w/Diff -CMP w/eGFR -Hemoglobin A1C  Mixed hyperlipidemia Checking labs today.  Continue current therapy for lipid control. Will modify as needed based on labwork results.   -CMP w/eGFR -Lipid Panel  Essential hypertension, benign Blood pressure well controlled with current medications.  Continue current therapy.  Will reassess at follow up.   - CBC w/Diff - CMP w/eGFR     Return in about 3 months (around 04/18/2024) for F/U.   Total time spent: 20 minutes  ALAN CHRISTELLA ARRANT, FNP  01/18/2024   This document may have been prepared by [facility] Voice Recognition software and as such may include unintentional dictation errors.

## 2024-01-19 LAB — CBC WITH DIFFERENTIAL/PLATELET
Basophils Absolute: 0 x10E3/uL (ref 0.0–0.2)
Basos: 0 %
EOS (ABSOLUTE): 0.1 x10E3/uL (ref 0.0–0.4)
Eos: 1 %
Hematocrit: 38.4 % (ref 34.0–46.6)
Hemoglobin: 12.6 g/dL (ref 11.1–15.9)
Immature Grans (Abs): 0 x10E3/uL (ref 0.0–0.1)
Immature Granulocytes: 0 %
Lymphocytes Absolute: 2.7 x10E3/uL (ref 0.7–3.1)
Lymphs: 28 %
MCH: 31.3 pg (ref 26.6–33.0)
MCHC: 32.8 g/dL (ref 31.5–35.7)
MCV: 96 fL (ref 79–97)
Monocytes Absolute: 0.8 x10E3/uL (ref 0.1–0.9)
Monocytes: 8 %
Neutrophils Absolute: 5.8 x10E3/uL (ref 1.4–7.0)
Neutrophils: 63 %
Platelets: 232 x10E3/uL (ref 150–450)
RBC: 4.02 x10E6/uL (ref 3.77–5.28)
RDW: 12 % (ref 11.7–15.4)
WBC: 9.4 x10E3/uL (ref 3.4–10.8)

## 2024-01-19 LAB — CMP14+EGFR
ALT: 15 IU/L (ref 0–32)
AST: 17 IU/L (ref 0–40)
Albumin: 4.2 g/dL (ref 3.9–4.9)
Alkaline Phosphatase: 54 IU/L (ref 44–121)
BUN/Creatinine Ratio: 16 (ref 9–23)
BUN: 14 mg/dL (ref 6–20)
Bilirubin Total: 0.2 mg/dL (ref 0.0–1.2)
CO2: 24 mmol/L (ref 20–29)
Calcium: 9 mg/dL (ref 8.7–10.2)
Chloride: 102 mmol/L (ref 96–106)
Creatinine, Ser: 0.89 mg/dL (ref 0.57–1.00)
Globulin, Total: 3.2 g/dL (ref 1.5–4.5)
Glucose: 71 mg/dL (ref 70–99)
Potassium: 3.9 mmol/L (ref 3.5–5.2)
Sodium: 138 mmol/L (ref 134–144)
Total Protein: 7.4 g/dL (ref 6.0–8.5)
eGFR: 87 mL/min/1.73 (ref >59)

## 2024-01-19 LAB — LIPID PANEL
Chol/HDL Ratio: 3.1 ratio (ref 0.0–4.4)
Cholesterol, Total: 131 mg/dL (ref 100–199)
HDL: 42 mg/dL (ref >39)
LDL Chol Calc (NIH): 76 mg/dL (ref 0–99)
Triglycerides: 63 mg/dL (ref 0–149)
VLDL Cholesterol Cal: 13 mg/dL (ref 5–40)

## 2024-01-19 LAB — VITAMIN D 25 HYDROXY (VIT D DEFICIENCY, FRACTURES): Vit D, 25-Hydroxy: 29.8 ng/mL — ABNORMAL LOW (ref 30.0–100.0)

## 2024-01-19 LAB — HEMOGLOBIN A1C
Est. average glucose Bld gHb Est-mCnc: 100 mg/dL
Hgb A1c MFr Bld: 5.1 % (ref 4.8–5.6)

## 2024-01-19 LAB — VITAMIN B12: Vitamin B-12: 1172 pg/mL (ref 232–1245)

## 2024-01-19 LAB — TSH: TSH: 1.86 u[IU]/mL (ref 0.450–4.500)

## 2024-04-20 ENCOUNTER — Ambulatory Visit: Payer: Self-pay | Admitting: Family
